# Patient Record
Sex: Male | Born: 1975 | Race: White | Hispanic: No | Marital: Single | Smoking: Never smoker
Health system: Southern US, Community
[De-identification: ages and names within clinical notes are randomized; demographics above are authoritative.]

## PROBLEM LIST (undated history)

## (undated) DIAGNOSIS — E559 Vitamin D deficiency, unspecified: Secondary | ICD-10-CM

## (undated) DIAGNOSIS — R778 Other specified abnormalities of plasma proteins: Secondary | ICD-10-CM

## (undated) DIAGNOSIS — R7301 Impaired fasting glucose: Secondary | ICD-10-CM

## (undated) DIAGNOSIS — E7800 Pure hypercholesterolemia, unspecified: Secondary | ICD-10-CM

## (undated) DIAGNOSIS — L80 Vitiligo: Secondary | ICD-10-CM

## (undated) DIAGNOSIS — Z299 Encounter for prophylactic measures, unspecified: Secondary | ICD-10-CM

## (undated) HISTORY — DX: Encounter for prophylactic measures, unspecified: Z29.9

## (undated) HISTORY — DX: Other specified abnormalities of plasma proteins: R77.8

## (undated) HISTORY — DX: Pure hypercholesterolemia, unspecified: E78.00

## (undated) HISTORY — DX: Vitamin D deficiency, unspecified: E55.9

## (undated) HISTORY — DX: Vitiligo: L80

## (undated) HISTORY — DX: Impaired fasting glucose: R73.01

---

## 2001-10-19 ENCOUNTER — Encounter: Payer: Self-pay | Admitting: Emergency Medicine

## 2001-10-19 ENCOUNTER — Emergency Department (HOSPITAL_COMMUNITY): Admission: EM | Admit: 2001-10-19 | Discharge: 2001-10-19 | Payer: Self-pay | Admitting: Emergency Medicine

## 2016-04-03 ENCOUNTER — Telehealth: Payer: Self-pay

## 2016-04-03 NOTE — Telephone Encounter (Signed)
Records from [facility] placed in your folder for review. Pt has NP appt with you on 04/04/2016 RLB

## 2016-04-04 ENCOUNTER — Encounter: Payer: Self-pay | Admitting: Family Medicine

## 2016-04-04 ENCOUNTER — Ambulatory Visit (INDEPENDENT_AMBULATORY_CARE_PROVIDER_SITE_OTHER): Payer: BC Managed Care – PPO | Admitting: Family Medicine

## 2016-04-04 VITALS — BP 122/82 | HR 87 | Ht 74.5 in | Wt 308.4 lb

## 2016-04-04 DIAGNOSIS — Z833 Family history of diabetes mellitus: Secondary | ICD-10-CM | POA: Diagnosis not present

## 2016-04-04 DIAGNOSIS — E785 Hyperlipidemia, unspecified: Secondary | ICD-10-CM | POA: Diagnosis not present

## 2016-04-04 DIAGNOSIS — E669 Obesity, unspecified: Secondary | ICD-10-CM | POA: Diagnosis not present

## 2016-04-04 DIAGNOSIS — Z23 Encounter for immunization: Secondary | ICD-10-CM

## 2016-04-04 DIAGNOSIS — D229 Melanocytic nevi, unspecified: Secondary | ICD-10-CM

## 2016-04-04 DIAGNOSIS — Z0000 Encounter for general adult medical examination without abnormal findings: Secondary | ICD-10-CM | POA: Diagnosis not present

## 2016-04-04 DIAGNOSIS — Z113 Encounter for screening for infections with a predominantly sexual mode of transmission: Secondary | ICD-10-CM | POA: Diagnosis not present

## 2016-04-04 DIAGNOSIS — Z7251 High risk heterosexual behavior: Secondary | ICD-10-CM

## 2016-04-04 DIAGNOSIS — B351 Tinea unguium: Secondary | ICD-10-CM | POA: Diagnosis not present

## 2016-04-04 HISTORY — DX: Hyperlipidemia, unspecified: E78.5

## 2016-04-04 LAB — POCT URINALYSIS DIPSTICK
Bilirubin, UA: NEGATIVE
Blood, UA: NEGATIVE
Glucose, UA: NEGATIVE
Ketones, UA: NEGATIVE
Leukocytes, UA: NEGATIVE
Nitrite, UA: NEGATIVE
Protein, UA: NEGATIVE
Spec Grav, UA: 1.01
Urobilinogen, UA: NEGATIVE
pH, UA: 6.5

## 2016-04-04 NOTE — Patient Instructions (Addendum)
Your received your Tdap today.  Call and schedule dermatology appointment for a full body screening. [facility] [phone] [facility] will call you for an appointment.   Try using a free app such as my fitness pal to keep an eye on your daily calories  We will call you with lab results.  Preventative Care for Adults, Male       REGULAR HEALTH EXAMS:  A routine yearly physical is a good way to check in with your primary care provider about your health and preventive screening. It is also an opportunity to share updates about your health and any concerns you have, and receive a thorough all-over exam.   Most health insurance companies pay for at least some preventative services.  Check with your health plan for specific coverages.  WHAT PREVENTATIVE SERVICES DO MEN NEED?  Adult men should have their weight and blood pressure checked regularly.   Men age 38 and older should have their cholesterol levels checked regularly.  Beginning at age 83 and continuing to age 53, men should be screened for colorectal cancer.  Certain people should may need continued testing until age 35.  Other cancer screening may include exams for testicular and prostate cancer.  Updating vaccinations is part of preventative care.  Vaccinations help protect against diseases such as the flu.  Lab tests are generally done as part of preventative care to screen for anemia and blood disorders, to screen for problems with the kidneys and liver, to screen for bladder problems, to check blood sugar, and to check your cholesterol level.  Preventative services generally include counseling about diet, exercise, avoiding tobacco, drugs, excessive alcohol consumption, and sexually transmitted infections.    GENERAL RECOMMENDATIONS FOR GOOD HEALTH:  Healthy diet:  Eat a variety of foods, including fruit, vegetables, animal or vegetable protein, such as meat, fish, chicken, and eggs, or beans, lentils, tofu, and  grains, such as rice.  Drink plenty of water daily.  Decrease saturated fat in the diet, avoid lots of red meat, processed foods, sweets, fast foods, and fried foods.  Exercise:  Aerobic exercise helps maintain good heart health. At least 30-40 minutes of moderate-intensity exercise is recommended. For example, a brisk walk that increases your heart rate and breathing. This should be done on most days of the week.   Find a type of exercise or a variety of exercises that you enjoy so that it becomes a part of your daily life.  Examples are running, walking, swimming, water aerobics, and biking.  For motivation and support, explore group exercise such as aerobic class, spin class, Zumba, Yoga,or  martial arts, etc.    Set exercise goals for yourself, such as a certain weight goal, walk or run in a race such as a 5k walk/run.  Speak to your primary care provider about exercise goals.  Disease prevention:  If you smoke or chew tobacco, find out from your caregiver how to quit. It can literally save your life, no matter how long you have been a tobacco user. If you do not use tobacco, never begin.   Maintain a healthy diet and normal weight. Increased weight leads to problems with blood pressure and diabetes.   The Body Mass Index or BMI is a way of measuring how much of your body is fat. Having a BMI above 27 increases the risk of heart disease, diabetes, hypertension, stroke and other problems related to obesity. Your caregiver can help determine your BMI and based on it develop  an exercise and dietary program to help you achieve or maintain this important measurement at a healthful level.  High blood pressure causes heart and blood vessel problems.  Persistent high blood pressure should be treated with medicine if weight loss and exercise do not work.   Fat and cholesterol leaves deposits in your arteries that can block them. This causes heart disease and vessel disease elsewhere in your body.   If your cholesterol is found to be high, or if you have heart disease or certain other medical conditions, then you may need to have your cholesterol monitored frequently and be treated with medication.   Ask if you should have a stress test if your history suggests this. A stress test is a test done on a treadmill that looks for heart disease. This test can find disease prior to there being a problem.  Avoid drinking alcohol in excess (more than two drinks per day).  Avoid use of street drugs. Do not share needles with anyone. Ask for professional help if you need assistance or instructions on stopping the use of alcohol, cigarettes, and/or drugs.  Brush your teeth twice a day with fluoride toothpaste, and floss once a day. Good oral hygiene prevents tooth decay and gum disease. The problems can be painful, unattractive, and can cause other health problems. Visit your dentist for a routine oral and dental check up and preventive care every 6-12 months.   Look at your skin regularly.  Use a mirror to look at your back. Notify your caregivers of changes in moles, especially if there are changes in shapes, colors, a size larger than a pencil eraser, an irregular border, or development of new moles.  Safety:  Use seatbelts 100% of the time, whether driving or as a passenger.  Use safety devices such as hearing protection if you work in environments with loud noise or significant background noise.  Use safety glasses when doing any work that could send debris in to the eyes.  Use a helmet if you ride a bike or motorcycle.  Use appropriate safety gear for contact sports.  Talk to your caregiver about gun safety.  Use sunscreen with a SPF (or skin protection factor) of 15 or greater.  Lighter skinned people are at a greater risk of skin cancer. Don't forget to also wear sunglasses in order to protect your eyes from too much damaging sunlight. Damaging sunlight can accelerate cataract formation.   Practice  safe sex. Use condoms. Condoms are used for birth control and to help reduce the spread of sexually transmitted infections (or STIs).  Some of the STIs are gonorrhea (the clap), chlamydia, syphilis, trichomonas, herpes, HPV (human papilloma virus) and HIV (human immunodeficiency virus) which causes AIDS. The herpes, HIV and HPV are viral illnesses that have no cure. These can result in disability, cancer and death.   Keep carbon monoxide and smoke detectors in your home functioning at all times. Change the batteries every 6 months or use a model that plugs into the wall.   Vaccinations:  Stay up to date with your tetanus shots and other required immunizations. You should have a booster for tetanus every 10 years. Be sure to get your flu shot every year, since 5%-20% of the [place] population comes down with the flu. The flu vaccine changes each year, so being vaccinated once is not enough. Get your shot in the fall, before the flu season peaks.   Other vaccines to consider:  Pneumococcal vaccine to protect against  certain types of pneumonia.  This is normally recommended for adults age 45 or older.  However, adults younger than 38 years old with certain underlying conditions such as diabetes, heart or lung disease should also receive the vaccine.  Shingles vaccine to protect against Varicella Zoster if you are older than age 24, or younger than 38 years old with certain underlying illness.  Hepatitis A vaccine to protect against a form of infection of the liver by a virus acquired from food.  Hepatitis B vaccine to protect against a form of infection of the liver by a virus acquired from blood or body fluids, particularly if you work in health care.  If you plan to travel internationally, check with your local health department for specific vaccination recommendations.  Cancer Screening:  Most routine colon cancer screening begins at the age of 42. On a yearly basis, doctors may provide special  easy to use take-home tests to check for hidden blood in the stool. Sigmoidoscopy or colonoscopy can detect the earliest forms of colon cancer and is life saving. These tests use a small camera at the end of a tube to directly examine the colon. Speak to your caregiver about this at age 94, when routine screening begins (and is repeated every 5 years unless early forms of pre-cancerous polyps or small growths are found).   At the age of 55 men usually start screening for prostate cancer every year. Screening may begin at a younger age for those with higher risk. Those at higher risk include African-Americans or having a family history of prostate cancer. There are two types of tests for prostate cancer:   Prostate-specific antigen (PSA) testing. Recent studies raise questions about prostate cancer using PSA and you should discuss this with your caregiver.   Digital rectal exam (in which your doctor's lubricated and gloved finger feels for enlargement of the prostate through the anus).   Screening for testicular cancer.  Do a monthly exam of your testicles. Gently roll each testicle between your thumb and fingers, feeling for any abnormal lumps. The best time to do this is after a hot shower or bath when the tissues are looser. Notify your caregivers of any lumps, tenderness or changes in size or shape immediately.

## 2016-04-04 NOTE — Progress Notes (Signed)
Subjective:    Patient ID: Andrew Harrington, male    DOB: 23-Feb-1976, 40 y.o.   MRN: [id number]  HPI Chief Complaint  Patient presents with  . Other    fasting cpe, eye exam done within a year. will get flu shot at work in november   He is new to the practice and here for a complete physical exam. Previous medical care: nothing regular since 2009 Last CPE: 2009 Complains of fungus to toenails, ongoing for years. Denies any pain or tenderness. Would like to discuss possibility of starting PrEP due to having sex with men and women. Denies history of HIV.   Only takes a MVI.   Other providers: Eyes- [facility]. Dentist- Dr. Mariel Sleet  Past medical history: sore throats occasionally Surgeries: none  Family history: Diabetes in Mother.   Social history: [place], works as a Actor,  Denies smoking and drug use, drinks alcohol occasionally  Diet: Non-discretionary. Exercise: walks the parking lot at school during the lunch hour.   Immunizations: needs Tdap  Health maintenance:  Colonoscopy: never Last PSA: never Last Dental Exam: twice a year Last Eye Exam: 04/2015 wears contact lenses.  Sexual activity: yes, new sexual partners including with men and women.  STD- denies history of  Wears seatbelt always, uses sunscreen, smoke detectors in home and functioning, does not text while driving, feels safe in home environment.  Reviewed allergies, medications, past medical, surgical, family, and social history.   Review of Systems Review of Systems Constitutional: -fever, -chills, -sweats, -unexpected weight change,-fatigue ENT: -runny nose, -ear pain, -sore throat Cardiology:  -chest pain, -palpitations, -edema Respiratory: -cough, -shortness of breath, -wheezing Gastroenterology: -abdominal pain, -nausea, -vomiting, -diarrhea, -constipation  Hematology: -bleeding or bruising problems Musculoskeletal: -arthralgias, -myalgias, -joint  swelling, -back pain Ophthalmology: -vision changes Urology: -dysuria, -difficulty urinating, -hematuria, -urinary frequency, -urgency Neurology: -headache, -weakness, -tingling, -numbness       Objective:   Physical Exam BP 122/82   Pulse 87   Ht 6' 2.5" (1.892 m)   Wt (!) 308 lb 6.4 oz (139.9 kg)   BMI 39.07 kg/m   General Appearance:    Alert, cooperative, no distress, appears stated age  Head:    Normocephalic, without obvious abnormality, atraumatic  Eyes:    PERRL, conjunctiva/corneas clear, EOM's intact, fundi    benign  Ears:    Normal TM's and external ear canals  Nose:   Nares normal, mucosa normal, no drainage or sinus   tenderness  Throat:   Lips, mucosa, and tongue normal; teeth and gums normal  Neck:   Supple, no lymphadenopathy;  thyroid:  no   enlargement/tenderness/nodules; no carotid   bruit or JVD  Back:    Spine nontender, no curvature, ROM normal, no CVA     tenderness  Lungs:     Clear to auscultation bilaterally without wheezes, rales or     ronchi; respirations unlabored  Chest Wall:    No tenderness or deformity   Heart:    Regular rate and rhythm, S1 and S2 normal, no murmur, rub   or gallop  Breast Exam:    No chest wall tenderness, masses or gynecomastia  Abdomen:     Soft, non-tender, nondistended, normoactive bowel sounds,    no masses, no hepatosplenomegaly  Genitalia:    Normal male external genitalia without lesions.  Testicles without masses.  No inguinal hernias.  Rectal:   Deferred due to age <40 and lack of symptoms  Extremities:   No clubbing, cyanosis or edema  Pulses:   2+ and symmetric all extremities  Skin:   Skin color, texture, turgor normal, no rashes or lesions, numerous nevi throughout. Plantar surfaces of bilateral feet with dry skin and peeling. Discolored and thickened toenails throughout   Lymph nodes:   Cervical, supraclavicular, and axillary nodes normal  Neurologic:   CNII-XII intact, normal strength, sensation and gait;  reflexes 2+ and symmetric throughout          Psych:   Normal mood, affect, hygiene and grooming.     Urinalysis dipstick:  negative      Assessment & Plan:  Routine general medical examination at a health care facility - Plan: CBC with Differential/Platelet, Comprehensive metabolic panel, POCT urinalysis dipstick, TSH  Family history of diabetes mellitus (DM)  Obesity - Plan: TSH  High risk sexual behavior - Plan: HIV antibody, RPR, GC/Chlamydia Probe Amp, Hepatitis B surface antibody, Hepatitis B core antibody, IgM, Hepatitis B surface antigen, CANCELED: Hepatitis B surface antigen, CANCELED: Hepatitis B core antibody, IgM  Screening for STD (sexually transmitted disease) - Plan: HIV antibody, RPR  Hyperlipidemia - Plan: Lipid panel  Need for Tdap vaccination - Plan: Tdap vaccine greater than or equal to 7yo IM  Onychomycosis - Plan: Ambulatory referral to Podiatry  Multiple nevi - Plan: Ambulatory referral to Dermatology  Advised that he is obese her BMI. Recommend getting at least 150 minutes of physical activity per week and eating a healthy well balanced diet. Advised to start using a free app called my fitness pal start tracking daily caloric intake. Discussed positive lifestyle modifications to avoid or postpone the development of chronic illnesses. He does report a history of hyperlipidemia and was on a medication at one point. Discussed safety and health promotion. He is considering starting PrEP due to having SWM and will order appropriate testing in case he decides to go forward with this. Discussed that prep is in addition to using condoms and safe sex practices and night in lieu of. Information given to patient in writing on [employer].  Referral made to podiatry for onychomycosis. Referral made to dermatology for a full body screening due to numerous nevi. Recommend continued use of sunscreen.  Follow up pending labs or in 1 year.

## 2016-04-05 LAB — COMPREHENSIVE METABOLIC PANEL
ALT: 14 U/L (ref 9–46)
AST: 14 U/L (ref 10–40)
Albumin: 4.5 g/dL (ref 3.6–5.1)
Alkaline Phosphatase: 69 U/L (ref 40–115)
BUN: 7 mg/dL (ref 7–25)
CO2: 23 mmol/L (ref 20–31)
Calcium: 9.5 mg/dL (ref 8.6–10.3)
Chloride: 100 mmol/L (ref 98–110)
Creat: 1.13 mg/dL (ref 0.60–1.35)
Glucose, Bld: 97 mg/dL (ref 65–99)
Potassium: 4.4 mmol/L (ref 3.5–5.3)
Sodium: 135 mmol/L (ref 135–146)
Total Bilirubin: 0.4 mg/dL (ref 0.2–1.2)
Total Protein: 7.2 g/dL (ref 6.1–8.1)

## 2016-04-05 LAB — TSH: TSH: 0.86 mIU/L (ref 0.40–4.50)

## 2016-04-05 LAB — CBC WITH DIFFERENTIAL/PLATELET
Basophils Absolute: 0 cells/uL (ref 0–200)
Basophils Relative: 0 %
Eosinophils Absolute: 72 cells/uL (ref 15–500)
Eosinophils Relative: 1 %
HCT: 42.8 % (ref 38.5–50.0)
Hemoglobin: 14.7 g/dL (ref 13.2–17.1)
Lymphocytes Relative: 29 %
Lymphs Abs: 2088 cells/uL (ref 850–3900)
MCH: 29.9 pg (ref 27.0–33.0)
MCHC: 34.3 g/dL (ref 32.0–36.0)
MCV: 87 fL (ref 80.0–100.0)
MPV: 9.2 fL (ref 7.5–12.5)
Monocytes Absolute: 360 cells/uL (ref 200–950)
Monocytes Relative: 5 %
Neutro Abs: 4680 cells/uL (ref 1500–7800)
Neutrophils Relative %: 65 %
Platelets: 309 10*3/uL (ref 140–400)
RBC: 4.92 MIL/uL (ref 4.20–5.80)
RDW: 13.6 % (ref 11.0–15.0)
WBC: 7.2 10*3/uL (ref 4.0–10.5)

## 2016-04-05 LAB — GC/CHLAMYDIA PROBE AMP
CT Probe RNA: NOT DETECTED
GC Probe RNA: NOT DETECTED

## 2016-04-05 LAB — LIPID PANEL
Cholesterol: 191 mg/dL (ref 125–200)
HDL: 37 mg/dL — ABNORMAL LOW (ref >=40)
LDL Cholesterol: 101 mg/dL (ref <130)
Total CHOL/HDL Ratio: 5.2 Ratio — ABNORMAL HIGH (ref <=5.0)
Triglycerides: 265 mg/dL — ABNORMAL HIGH (ref <150)
VLDL: 53 mg/dL — ABNORMAL HIGH (ref <30)

## 2016-04-05 LAB — HEPATITIS B SURFACE ANTIBODY,QUALITATIVE: Hep B S Ab: NEGATIVE

## 2016-04-05 LAB — RPR

## 2016-04-05 LAB — HEPATITIS B SURFACE ANTIGEN: HEP B S AG: NEGATIVE

## 2016-04-05 LAB — HEPATITIS B CORE ANTIBODY, IGM: Hep B C IgM: NONREACTIVE

## 2016-04-05 LAB — HIV ANTIBODY (ROUTINE TESTING W REFLEX): HIV 1&2 Ab, 4th Generation: NONREACTIVE

## 2016-04-06 ENCOUNTER — Encounter: Payer: Self-pay | Admitting: Internal Medicine

## 2016-04-06 ENCOUNTER — Other Ambulatory Visit: Payer: Self-pay | Admitting: Family Medicine

## 2016-04-06 DIAGNOSIS — Z609 Problem related to social environment, unspecified: Secondary | ICD-10-CM

## 2016-04-06 DIAGNOSIS — Z79899 Other long term (current) drug therapy: Secondary | ICD-10-CM

## 2016-04-06 MED ORDER — EMTRICITABINE-TENOFOVIR DF 200-300 MG PO TABS: 1.0000 | ORAL_TABLET | Freq: Every day | ORAL | 2 refills | Status: DC

## 2016-04-13 ENCOUNTER — Ambulatory Visit (INDEPENDENT_AMBULATORY_CARE_PROVIDER_SITE_OTHER): Payer: BC Managed Care – PPO | Admitting: Family Medicine

## 2016-04-13 ENCOUNTER — Encounter: Payer: Self-pay | Admitting: Family Medicine

## 2016-04-13 VITALS — BP 120/82 | HR 62 | Temp 98.4°F | Wt 304.4 lb

## 2016-04-13 DIAGNOSIS — H109 Unspecified conjunctivitis: Secondary | ICD-10-CM | POA: Diagnosis not present

## 2016-04-13 MED ORDER — POLYMYXIN B-TRIMETHOPRIM 10000-0.1 UNIT/ML-% OP SOLN: 2.0000 [drp] | Freq: Four times a day (QID) | OPHTHALMIC | 0 refills | Status: DC

## 2016-04-13 MED ORDER — POLYMYXIN B-TRIMETHOPRIM 10000-0.1 UNIT/ML-% OP SOLN
1.0000 [drp] | Freq: Four times a day (QID) | OPHTHALMIC | 0 refills | Status: DC
Start: 2016-04-13 — End: 2016-04-25

## 2016-04-13 NOTE — Patient Instructions (Addendum)
Use 1 drop to right eye four times daily for the next 5 days. Use good hand hygiene and change out your contact lenses.    Bacterial Conjunctivitis Bacterial conjunctivitis, commonly called pink eye, is an inflammation of the clear membrane that covers the white part of the eye (conjunctiva). The inflammation can also happen on the underside of the eyelids. The blood vessels in the conjunctiva become inflamed, causing the eye to become red or pink. Bacterial conjunctivitis may spread easily from one eye to another and from person to person (contagious).  CAUSES  Bacterial conjunctivitis is caused by bacteria. The bacteria may come from your own skin, your upper respiratory tract, or from someone else with bacterial conjunctivitis. SYMPTOMS  The normally white color of the eye or the underside of the eyelid is usually pink or red. The pink eye is usually associated with irritation, tearing, and some sensitivity to light. Bacterial conjunctivitis is often associated with a thick, yellowish discharge from the eye. The discharge may turn into a crust on the eyelids overnight, which causes your eyelids to stick together. If a discharge is present, there may also be some blurred vision in the affected eye. DIAGNOSIS  Bacterial conjunctivitis is diagnosed by your caregiver through an eye exam and the symptoms that you report. Your caregiver looks for changes in the surface tissues of your eyes, which may point to the specific type of conjunctivitis. A sample of any discharge may be collected on a cotton-tip swab if you have a severe case of conjunctivitis, if your cornea is affected, or if you keep getting repeat infections that do not respond to treatment. The sample will be sent to a lab to see if the inflammation is caused by a bacterial infection and to see if the infection will respond to antibiotic medicines. TREATMENT   Bacterial conjunctivitis is treated with antibiotics. Antibiotic eyedrops are most  often used. However, antibiotic ointments are also available. Antibiotics pills are sometimes used. Artificial tears or eye washes may ease discomfort. HOME CARE INSTRUCTIONS   To ease discomfort, apply a cool, clean washcloth to your eye for 10-20 minutes, 3-4 times a day.  Gently wipe away any drainage from your eye with a warm, wet washcloth or a cotton ball.  Wash your hands often with soap and water. Use paper towels to dry your hands.  Do not share towels or washcloths. This may spread the infection.  Change or wash your pillowcase every day.  You should not use eye makeup until the infection is gone.  Do not operate machinery or drive if your vision is blurred.  Stop using contact lenses. Ask your caregiver how to sterilize or replace your contacts before using them again. This depends on the type of contact lenses that you use.  When applying medicine to the infected eye, do not touch the edge of your eyelid with the eyedrop bottle or ointment tube. SEEK IMMEDIATE MEDICAL CARE IF:   Your infection has not improved within 3 days after beginning treatment.  You had yellow discharge from your eye and it returns.  You have increased eye pain.  Your eye redness is spreading.  Your vision becomes blurred.  You have a fever or persistent symptoms for more than 2-3 days.  You have a fever and your symptoms suddenly get worse.  You have facial pain, redness, or swelling. MAKE SURE YOU:   Understand these instructions.  Will watch your condition.  Will get help right away if you are  not doing well or get worse.   This information is not intended to replace advice given to you by your health care provider. Make sure you discuss any questions you have with your health care provider.   Document Released: 08/06/2005 Document Revised: 08/27/2014 Document Reviewed: 01/07/2012 Elsevier Interactive Patient Education [employer].

## 2016-04-13 NOTE — Progress Notes (Signed)
   Subjective:    Patient ID: Andrew Harrington, male    DOB: [birth year], 40 y.o.   MRN: [id number]  HPI Chief Complaint  Patient presents with  . Conjunctivitis    pink eye- crusty stuff- puffy    He is here with complaints of a day history of a 2 day history of right eye being red and then last night he noticed puffiness to his upper eyelid. He states this morning he woke up with purulent drainage to the right eye. No issues with vision or foreign body sensation. No eye pain. Denies itching Denies fever, chills, headache, nasal congestion, rhinorrhea, ear pain, sore throat, nausea, vomiting. He does wear contact lenses and has not worn them in the past 24 hours.   No past medical history on file. Reviewed allergies, medications, past medical,and social history.   Review of Systems Pertinent positives and negatives in the history of present illness.     Objective:   Physical Exam  Constitutional: He appears well-developed and well-nourished. No distress.  HENT:  Mouth/Throat: Uvula is midline, oropharynx is clear and moist and mucous membranes are normal.  Eyes: EOM are normal. Pupils are equal, round, and reactive to light. Right eye exhibits discharge. Right conjunctiva is injected.  Neck: Normal range of motion. Neck supple.  Skin: Skin is warm and dry. No rash noted. No pallor.   BP 120/82   Pulse 62   Temp 98.4 F (36.9 C) (Oral)   Wt (!) 304 lb 6.4 oz (138.1 kg)   BMI 38.56 kg/m       Assessment & Plan:  Conjunctivitis of right eye  Polytrim prescribed. Discussed management of bacterial conjunctivitis including using good hand hygiene. Recommend that he change contact lenses. He will follow up as needed.

## 2016-04-25 ENCOUNTER — Ambulatory Visit (INDEPENDENT_AMBULATORY_CARE_PROVIDER_SITE_OTHER): Payer: BC Managed Care – PPO | Admitting: Podiatry

## 2016-04-25 ENCOUNTER — Encounter: Payer: Self-pay | Admitting: Podiatry

## 2016-04-25 VITALS — Resp 16 | Ht 74.0 in | Wt 300.0 lb

## 2016-04-25 DIAGNOSIS — B351 Tinea unguium: Secondary | ICD-10-CM | POA: Diagnosis not present

## 2016-04-25 MED ORDER — TERBINAFINE HCL 250 MG PO TABS: 250.0000 mg | ORAL_TABLET | Freq: Every day | ORAL | 0 refills | Status: DC

## 2016-04-25 NOTE — Progress Notes (Signed)
   Subjective:    Patient ID: Andrew Harrington, male    DOB: 02-Oct-1975, 40 y.o.   MRN: [id number]  HPI Chief Complaint  Patient presents with  . Nail Problem    Bilateral; discoloration & thickened nails; pt stated, "wants nails checked for nail fungus"      Review of Systems  All other systems reviewed and are negative.      Objective:   Physical Exam        Assessment & Plan:

## 2016-04-26 NOTE — Progress Notes (Signed)
Subjective:     Patient ID: Andrew Harrington, male   DOB: 07-11-1976, 40 y.o.   MRN: [id number]  HPI patient states she's had problems with his nails and skin for a number of years and they continue to discolor and get thick and he's tried topical without relief   Review of Systems  All other systems reviewed and are negative.      Objective:   Physical Exam  Constitutional: He is oriented to person, place, and time.  Cardiovascular: Intact distal pulses.   Musculoskeletal: Normal range of motion.  Neurological: He is oriented to person, place, and time.  Skin: Skin is warm.  Nursing note and vitals reviewed.  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 Also we did order blood work to check liver function at this time

## 2016-05-01 LAB — HEPATIC FUNCTION PANEL
ALBUMIN: 4.3 g/dL (ref 3.6–5.1)
ALK PHOS: 67 U/L (ref 40–115)
ALT: 12 U/L (ref 9–46)
AST: 14 U/L (ref 10–40)
BILIRUBIN DIRECT: 0.1 mg/dL (ref <=0.2)
Indirect Bilirubin: 0.3 mg/dL (ref 0.2–1.2)
Total Bilirubin: 0.4 mg/dL (ref 0.2–1.2)
Total Protein: 7 g/dL (ref 6.1–8.1)

## 2016-05-03 ENCOUNTER — Encounter: Payer: Self-pay | Admitting: Family Medicine

## 2016-05-09 ENCOUNTER — Telehealth: Payer: Self-pay | Admitting: Family Medicine

## 2016-05-09 MED ORDER — TERBINAFINE HCL 250 MG PO TABS: 250.0000 mg | ORAL_TABLET | Freq: Every day | ORAL | 0 refills | Status: DC

## 2016-05-09 MED ORDER — EMTRICITABINE-TENOFOVIR DF 200-300 MG PO TABS: 1.0000 | ORAL_TABLET | Freq: Every day | ORAL | 2 refills | Status: DC

## 2016-05-09 NOTE — Telephone Encounter (Signed)
Pt called stating that he need to pick up a refill on [address] however his insurance requires that he get all of [facility] meds from [facility] so pt wants meds transferred to [place] [facility] [address]

## 2016-05-09 NOTE — Telephone Encounter (Signed)
Sent meds to new pharmacy.  

## 2016-05-29 ENCOUNTER — Ambulatory Visit (INDEPENDENT_AMBULATORY_CARE_PROVIDER_SITE_OTHER): Payer: BC Managed Care – PPO

## 2016-05-29 DIAGNOSIS — B351 Tinea unguium: Secondary | ICD-10-CM

## 2016-05-29 NOTE — Progress Notes (Signed)
Pt presents with mycotic infection of nails Rt 1,2,3,4,5 Lt 1,3,5  All other systems are negative  Laser therapy administered to affected nails and tolerated well. All safety precautions were in place, re-appointed in 1 month for 2nd therapy. Progress pictures were obtained at this visit

## 2016-06-27 ENCOUNTER — Ambulatory Visit (INDEPENDENT_AMBULATORY_CARE_PROVIDER_SITE_OTHER): Payer: Self-pay

## 2016-06-27 DIAGNOSIS — B351 Tinea unguium: Secondary | ICD-10-CM

## 2016-06-27 NOTE — Progress Notes (Signed)
Pt presents with mycotic infection of nails Rt 1,2,3,4,5 Lt 1,3,5  All other systems are negative  Laser therapy administered to affected nails and tolerated well. All safety precautions were in place, re-appointed in 1 month for 3rd therapy. Progress pictures were obtained at this visit

## 2016-07-05 ENCOUNTER — Ambulatory Visit: Payer: BC Managed Care – PPO | Admitting: Family Medicine

## 2016-07-05 ENCOUNTER — Other Ambulatory Visit: Payer: BC Managed Care – PPO

## 2016-07-05 DIAGNOSIS — Z79899 Other long term (current) drug therapy: Secondary | ICD-10-CM

## 2016-07-05 DIAGNOSIS — Z609 Problem related to social environment, unspecified: Secondary | ICD-10-CM

## 2016-07-06 ENCOUNTER — Other Ambulatory Visit: Payer: Self-pay | Admitting: Family Medicine

## 2016-07-06 LAB — HIV ANTIBODY (ROUTINE TESTING W REFLEX): HIV: NONREACTIVE

## 2016-07-06 MED ORDER — EMTRICITABINE-TENOFOVIR DF 200-300 MG PO TABS: 1.0000 | ORAL_TABLET | Freq: Every day | ORAL | 2 refills | Status: DC

## 2016-07-20 ENCOUNTER — Other Ambulatory Visit: Payer: Self-pay | Admitting: Family Medicine

## 2016-07-20 NOTE — Telephone Encounter (Signed)
Ok to refill for 3 months. He had a negative HIV test 2 weeks ago.

## 2016-07-20 NOTE — Telephone Encounter (Signed)
See request below

## 2016-07-30 ENCOUNTER — Ambulatory Visit: Payer: Self-pay | Admitting: Podiatry

## 2016-07-30 DIAGNOSIS — B351 Tinea unguium: Secondary | ICD-10-CM

## 2016-07-30 NOTE — Progress Notes (Signed)
Pt presents with mycotic infection of nails Rt 1,2,3,4,5 Lt 1,3,5  All other systems are negative  Laser therapy administered to affected nails and tolerated well. All safety precautions were in place, re-appointed in 1 month for 5th and possibly final therapy

## 2016-08-06 ENCOUNTER — Encounter: Payer: Self-pay | Admitting: Medical

## 2016-08-06 ENCOUNTER — Ambulatory Visit (INDEPENDENT_AMBULATORY_CARE_PROVIDER_SITE_OTHER): Payer: BC Managed Care – PPO | Admitting: Medical

## 2016-08-06 VITALS — BP 130/80 | HR 66 | Temp 98.3°F | Resp 18 | Wt 303.6 lb

## 2016-08-06 DIAGNOSIS — J0110 Acute frontal sinusitis, unspecified: Secondary | ICD-10-CM

## 2016-08-06 MED ORDER — AMOXICILLIN 500 MG PO TABS: ORAL_TABLET | ORAL | 0 refills | Status: DC

## 2016-08-06 NOTE — Progress Notes (Signed)
Subjective:  Andrew Harrington is a 40 y.o. male who presents for possible sinus infection.  Started almost a week ago with head and sinus pressure, nasal congestion, headache, and was worse [place]. Denies fever, NVD, no SOB, no teeth pain or sore throat.   Using afrin, sudafed and this helps a little but symptoms are progressively getting worse.  denies sick contacts.  Patient is not a smoker. No other aggravating or relieving factors.  No other c/o.  No significant past medical history  ROS as in subjective   Objective: BP 130/80   Pulse 66   Temp 98.3 F (36.8 C) (Oral)   Resp 18   Wt (!) 303 lb 9.6 oz (137.7 kg)   SpO2 98%   BMI 38.98 kg/m   General appearance: Alert, WD/WN, no distress                             Skin: warm, no rash                           Head: +frontal sinus tenderness,                            Eyes: conjunctiva normal, corneas clear, PERRLA                            Ears: pearly TMs, external ear canals normal                          Nose: septum midline, turbinates swollen, with erythema and clear discharge             Mouth/throat: MMM, tongue normal, mild pharyngeal erythema                           Neck: supple, no adenopathy, no thyromegaly, non tender                         Lungs: CTA bilaterally, no wheezes, rales, or rhonchi       Assessment  Encounter Diagnosis  Name Primary?  . Acute frontal sinusitis, recurrence not specified Yes      Plan: Discuss exam findings.  Begin amoxicillin, need to limit afrin to QHS 1-2 more nights only.     Specific home care recommendations today include:  Only take over-the-counter (OTC) or prescription medicines for pain, discomfort, or fever as directed by your caregiver.    Decongestant: You may use OTC Guaifenesin (Mucinex plain) for congestion.  You may use Pseudoephedrine (Sudafed) only if you don't have blood pressure problems or a  diagnosis of hypertension.  Cough suppression: If you have cough from drainage, you may use over-the-counter Dextromethorphan (Delsym) as directed on the label  Pain/fever relief: You may use over-the-counter Tylenol for pain or fever  Drink extra fluids. Fluids help thin the mucus so your sinuses can drain more easily.   Applying either moist heat or ice packs to the sinus areas may help relieve discomfort.  Use saline nasal sprays to help moisten your sinuses. The sprays can be found at your local drugstore.   Andrew Harrington was seen today for nasal congestion.  Diagnoses and all  orders for this visit:  Acute frontal sinusitis, recurrence not specified  Other orders -     amoxicillin (AMOXIL) 500 MG tablet; 2 tablets po BID x 10 days  Patient was advised to call or return if worse or not improving in the next few days.    Patient voiced understanding of diagnosis, recommendations, and treatment plan.

## 2016-09-03 ENCOUNTER — Ambulatory Visit: Payer: Self-pay | Admitting: Podiatry

## 2016-09-03 DIAGNOSIS — B351 Tinea unguium: Secondary | ICD-10-CM

## 2016-09-03 NOTE — Progress Notes (Signed)
Pt presents with mycotic infection of nails Rt 1,2,3,4,5 Lt 1,3,5  All other systems are negative  Laser therapy administered to affected nails and tolerated well. All safety precautions were in place, re-appointed as needed

## 2016-09-19 ENCOUNTER — Encounter: Payer: Self-pay | Admitting: Family Medicine

## 2016-09-19 ENCOUNTER — Ambulatory Visit (INDEPENDENT_AMBULATORY_CARE_PROVIDER_SITE_OTHER): Payer: BC Managed Care – PPO | Admitting: Family Medicine

## 2016-09-19 VITALS — BP 124/80 | HR 77 | Wt 303.2 lb

## 2016-09-19 DIAGNOSIS — Z23 Encounter for immunization: Secondary | ICD-10-CM

## 2016-09-19 DIAGNOSIS — N529 Male erectile dysfunction, unspecified: Secondary | ICD-10-CM | POA: Diagnosis not present

## 2016-09-19 MED ORDER — SILDENAFIL CITRATE 100 MG PO TABS: 50.0000 mg | ORAL_TABLET | Freq: Every day | ORAL | 3 refills | Status: DC | PRN

## 2016-09-19 NOTE — Progress Notes (Signed)
   Subjective:    Patient ID: Andrew Harrington, male    DOB: Mar 09, 1976, 41 y.o.   MRN: [id number]  HPI Chief Complaint  Patient presents with  . discuss ED    discuss ED- happen the last 3 times out of 4 times.   He is here with complaints of difficulty keeping an erection 2 out of 3 times over the past 6 weeks. He is able to get an erection without difficulty. No other concerns or complaints.   No new medications. Denies history of diabetes, HTN or PVD.   Denies fever, chills, night sweats, fatigue, chest pain, palpitations, shortness of breath, abdominal pain, back pain, GI or GU symptoms.   Is not a smoker. Alcohol- 1 beer typically. Denies drug use.   Has been exercising recently and is trying to lose weight.   Recent labs show that he is Hep B antibody negative. He has high risk sexual behavior, sex with men and needs Hep B series.   Reviewed allergies, medications, past medical, surgical, and social history.   Review of Systems Pertinent positives and negatives in the history of present illness.     Objective:   Physical Exam  Constitutional: He appears well-developed and well-nourished. No distress.  Cardiovascular: Normal rate, regular rhythm, normal heart sounds and intact distal pulses.   Pulmonary/Chest: Effort normal and breath sounds normal.  Genitourinary: Testes normal and penis normal. Cremasteric reflex is present. Circumcised.  Skin: Skin is warm and dry. No rash noted. No pallor.  Psychiatric: He has a normal mood and affect. His speech is normal and behavior is normal. Thought content normal.   BP 124/80   Pulse 77   Wt (!) 303 lb 3.2 oz (137.5 kg)   BMI 38.93 kg/m      Assessment & Plan:  Unable to sustain erection - Plan: sildenafil (VIAGRA) 100 MG tablet  Need for hepatitis B vaccination - Plan: Hepatitis B vaccine adult IM  Discussed use and potential side effects of viagra. Prescription sent to pharmacy.  He is hep B antibody negative and high  risk sexual behavior. Plan to start the series today.  Hep B shot given.

## 2016-10-07 ENCOUNTER — Telehealth: Payer: Self-pay | Admitting: Family Medicine

## 2016-10-07 NOTE — Telephone Encounter (Signed)
Left vm for him to come in fasting for his visit tomorrow. Need to repeat fasting lipids.

## 2016-10-08 ENCOUNTER — Ambulatory Visit (INDEPENDENT_AMBULATORY_CARE_PROVIDER_SITE_OTHER): Payer: BC Managed Care – PPO | Admitting: Family Medicine

## 2016-10-08 ENCOUNTER — Encounter: Payer: Self-pay | Admitting: Family Medicine

## 2016-10-08 VITALS — BP 110/70 | HR 70 | Wt 299.4 lb

## 2016-10-08 DIAGNOSIS — Z79899 Other long term (current) drug therapy: Secondary | ICD-10-CM

## 2016-10-08 DIAGNOSIS — E782 Mixed hyperlipidemia: Secondary | ICD-10-CM | POA: Diagnosis not present

## 2016-10-08 LAB — LIPID PANEL
Cholesterol: 205 mg/dL — ABNORMAL HIGH (ref <200)
HDL: 38 mg/dL — AB (ref >40)
LDL Cholesterol: 120 mg/dL — ABNORMAL HIGH (ref <100)
TRIGLYCERIDES: 235 mg/dL — AB (ref <150)
Total CHOL/HDL Ratio: 5.4 Ratio — ABNORMAL HIGH (ref <5.0)
VLDL: 47 mg/dL — ABNORMAL HIGH (ref <30)

## 2016-10-08 NOTE — Progress Notes (Signed)
   Subjective:    Patient ID: Andrew Harrington, male    DOB: [birth year], 41 y.o.   MRN: [id number]  HPI Chief Complaint  Patient presents with  . fasting follow-up    fasting follow-up   He is here for a fasting lab visit due to abnormal lipid panel in August 2017. He has lost 4 lbs. States he has been exercising and eating healthier. Less fried food. No concerns or complaints today.   He is taking Truvada and doing well on medication. No side effects. Has been on medication for PrEP for 6 months due to same sex relationships.   Denies fever, chills, body aches, chest pain, cough, abdominal pain, N/V/D, urinary frequency, dysuria or penile discharge.    Reviewed allergies, medications, past medical, and social history.   Review of Systems Pertinent positives and negatives in the history of present illness.     Objective:   Physical Exam BP 110/70   Pulse 70   Wt 299 lb 6.4 oz (135.8 kg)   SpO2 98%   BMI 38.44 kg/m   Alert and oriented and in no acute distress. Not otherwise examined.       Assessment & Plan:  Encounter for medication management - Plan: GC/Chlamydia Probe Amp, RPR, HIV antibody  Mixed hyperlipidemia - Plan: Lipid panel  Discussed that he appears to be doing well and making healthy lifestyle changes. Congratulated him on 4 lb weight loss. Advised on low fat and low cholesterol diet. Continue exercising.  Will repeat fasting lipids today.  Doing fine on Truvada for PrEP. Will check HIV, RPR and GC/CT.  Follow up and refill Truvada once labs are back.  He will follow up for lab visit for HIV in 3 months per protocol.

## 2016-10-08 NOTE — Addendum Note (Signed)
Addended by: Carolee Rota F on: 10/08/2016 01:02 PM   Modules accepted: Orders

## 2016-10-08 NOTE — Patient Instructions (Signed)

## 2016-10-09 ENCOUNTER — Other Ambulatory Visit: Payer: Self-pay | Admitting: Family Medicine

## 2016-10-09 DIAGNOSIS — Z79899 Other long term (current) drug therapy: Secondary | ICD-10-CM

## 2016-10-09 LAB — GC/CHLAMYDIA PROBE AMP
CT Probe RNA: NOT DETECTED
GC Probe RNA: NOT DETECTED

## 2016-10-09 LAB — RPR

## 2016-10-09 LAB — HIV ANTIBODY (ROUTINE TESTING W REFLEX): HIV 1&2 Ab, 4th Generation: NONREACTIVE

## 2016-10-09 MED ORDER — EMTRICITABINE-TENOFOVIR DF 200-300 MG PO TABS: ORAL_TABLET | ORAL | 2 refills | Status: DC

## 2016-10-12 ENCOUNTER — Encounter: Payer: Self-pay | Admitting: Family Medicine

## 2016-10-15 ENCOUNTER — Other Ambulatory Visit: Payer: Self-pay | Admitting: Family Medicine

## 2016-10-15 MED ORDER — EMTRICITABINE-TENOFOVIR DF 200-300 MG PO TABS: ORAL_TABLET | ORAL | 0 refills | Status: DC

## 2016-10-17 ENCOUNTER — Ambulatory Visit (INDEPENDENT_AMBULATORY_CARE_PROVIDER_SITE_OTHER): Payer: BC Managed Care – PPO | Admitting: Medical

## 2016-10-17 ENCOUNTER — Encounter: Payer: Self-pay | Admitting: Medical

## 2016-10-17 VITALS — BP 140/98 | HR 92 | Temp 98.5°F | Wt 299.4 lb

## 2016-10-17 DIAGNOSIS — J029 Acute pharyngitis, unspecified: Secondary | ICD-10-CM | POA: Diagnosis not present

## 2016-10-17 DIAGNOSIS — J020 Streptococcal pharyngitis: Secondary | ICD-10-CM | POA: Diagnosis not present

## 2016-10-17 DIAGNOSIS — R6889 Other general symptoms and signs: Secondary | ICD-10-CM | POA: Diagnosis not present

## 2016-10-17 LAB — POC INFLUENZA A&B (BINAX/QUICKVUE)
INFLUENZA A, POC: NEGATIVE
INFLUENZA B, POC: NEGATIVE

## 2016-10-17 MED ORDER — AMOXICILLIN 500 MG PO CAPS: 500.0000 mg | ORAL_CAPSULE | Freq: Three times a day (TID) | ORAL | 0 refills | Status: DC

## 2016-10-17 NOTE — Progress Notes (Signed)
  Subjective:  Andrew Harrington is a 41 y.o. male who presents for illness.  Started 2 nights ago with not feeling well, not sleeping well.   Worse by end of the day the first day.   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   He reports chills, body aches, not sure about fever.   Has sore throat, no cough.   No runny nose, no sneezing.   No ear pain, no NVD.   Hasn't eaten much in the past day or 2.   Drinking water though.   Nonsmoker.  Using some ibuprofen last night for headache.  No other aggravating or relieving factors.  No other c/o.  The following portions of the patient's history were reviewed and updated as appropriate: allergies, current medications, past medical history, past social history and problem list.  ROS as in subjective   No past medical history on file.   Objective: BP (!) 140/98   Pulse 92   Temp 98.5 F (36.9 C)   Wt 299 lb 6.4 oz (135.8 kg)   SpO2 99%   BMI 38.44 kg/m   General: somewhat Ill-appearing, well-developed, well-nourished Skin: warm, dry HEENT: Nose inflamed and congested, clear conjunctiva, TMs pearly, no sinus tenderness, pharynx with moderate erythema, no exudates Neck: Supple, non tender, shotty cervical adenopathy Heart: Regular rate and rhythm, normal S1, S2, no murmurs Lungs: Clear to auscultation bilaterally, no wheezes, rales, rhonchi Extremities: Mild generalized tenderness   Assessment: Encounter Diagnoses  Name Primary?  . Strep pharyngitis Yes  . Flu-like symptoms   . Sore throat      Plan: Advised that sore throat etiology appears to be bacterial.  Discussed symptoms, diagnosis, and possible complications including peritonsillar abscess formation.  Advised that they will be infectious for 24 hours after starting antibiotics.  Discussed means of prevention, precautions.  Supportive care recommended including OTC analgesics, salt water gargles, warm fluids, good hydration, and rest.   Discussed signs or symptoms that would prompt immediate evaluation.   Call or return if worse or not improving in the next 2-3 days.  Patient voiced understanding of diagnosis, recommendations, and treatment plan.  After visit summary given.  Gave note for work.  Andrew Harrington was seen today for possible flu.  Diagnoses and all orders for this visit:  Strep pharyngitis  Flu-like symptoms -     POC Influenza A&B(BINAX/QUICKVUE)  Sore throat  Other orders -     amoxicillin (AMOXIL) 500 MG capsule; Take 1 capsule (500 mg total) by mouth 3 (three) times daily.

## 2016-10-21 ENCOUNTER — Encounter (HOSPITAL_COMMUNITY): Payer: Self-pay | Admitting: Emergency Medicine

## 2016-10-21 ENCOUNTER — Ambulatory Visit (HOSPITAL_COMMUNITY)
Admission: EM | Admit: 2016-10-21 | Discharge: 2016-10-21 | Disposition: A | Discharge status: Home / Self Care | Payer: BC Managed Care – PPO | Attending: Family Medicine | Admitting: Family Medicine

## 2016-10-21 DIAGNOSIS — R519 Headache, unspecified: Secondary | ICD-10-CM

## 2016-10-21 DIAGNOSIS — B349 Viral infection, unspecified: Secondary | ICD-10-CM

## 2016-10-21 DIAGNOSIS — R51 Headache: Secondary | ICD-10-CM

## 2016-10-21 MED ORDER — DEXAMETHASONE SODIUM PHOSPHATE 10 MG/ML IJ SOLN
INTRAMUSCULAR | Status: AC
  Filled 2016-10-21: qty 1

## 2016-10-21 MED ORDER — KETOROLAC TROMETHAMINE 60 MG/2ML IM SOLN
60.0000 mg | Freq: Once | INTRAMUSCULAR | Status: AC
  Administered 2016-10-21: 60 mg via INTRAMUSCULAR

## 2016-10-21 MED ORDER — ACETAMINOPHEN 325 MG PO TABS
ORAL_TABLET | ORAL | Status: AC
  Filled 2016-10-21: qty 2

## 2016-10-21 MED ORDER — KETOROLAC TROMETHAMINE 60 MG/2ML IM SOLN
INTRAMUSCULAR | Status: AC
  Filled 2016-10-21: qty 2

## 2016-10-21 MED ORDER — DEXAMETHASONE SODIUM PHOSPHATE 10 MG/ML IJ SOLN
10.0000 mg | Freq: Once | INTRAMUSCULAR | Status: AC
  Administered 2016-10-21: 10 mg via INTRAMUSCULAR

## 2016-10-21 MED ORDER — ACETAMINOPHEN 325 MG PO TABS
650.0000 mg | ORAL_TABLET | Freq: Once | ORAL | Status: AC
  Administered 2016-10-21: 650 mg via ORAL

## 2016-10-21 NOTE — Discharge Instructions (Signed)
The cause of your headache is uncertain. Potential causes include the fact that she recently had a viral illness and this is a remnant of those symptoms. Tension headache is a possibility. Although we do not know the exact cause of the headache with the medications given here will help and you are not showing any signs or telling me of any symptoms that are especially worrisome for an intracranial problem. If you are not better tomorrow recommend that she call your primary care provider. If you develop any of the symptoms that we discussed and that are listed on your headache instruction sheets or any abnormalities or concerns or worsening he should go directly to the emergency department.

## 2016-10-21 NOTE — ED Triage Notes (Signed)
Patient presents to [facility] today with a complaint of a headache since Thursday. Patient states that he was diagnosed with Strep on Weds and is currently taking an antibiotic. He states that he does have spells of feeling warm and reports chills.

## 2016-10-21 NOTE — ED Provider Notes (Signed)
CSN: [id number]     Arrival date & time 10/21/16  1218 History   First MD Initiated Contact with Patient 10/21/16 1448     Chief Complaint  Patient presents with  . 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 He states that 4 days ago he was diagnosed by a PCP with strep throat. He also had some minor symptoms similar to flulike illness. He tested positive for strep negative for flu symptoms. Approximate 2 days ago he developed a headache located in the bilateral upper forehead. Occasionally has some lightheadedness. There is no radiation of pain. He has taken ibuprofen with minimal in temporary relief but headache persists. He has also had some minor body aches as well as a previous sore throat but a currently no fevers. Denies problems with vision, speech, hearing, swallowing, focal paresthesias or weakness. Denies trouble with gait or coordination. Denies problems with cognition, memory, recall; confusion or disorientation. No history of trauma.      History reviewed. No pertinent past medical history. History reviewed. No pertinent surgical history. Family History  Problem Relation Age of Onset  . Diabetes Mother   . Aneurysm Father    Social History  Substance Use Topics  . Smoking status: Never Smoker  . Smokeless tobacco: Never Used  . Alcohol use Yes     Comment: social    Review of Systems  Constitutional: Negative.   HENT: Negative for congestion, ear pain, postnasal drip, rhinorrhea, sore throat, trouble swallowing and voice change.   Eyes: Negative.  Negative for photophobia, pain and visual disturbance.  Respiratory: Negative.   Cardiovascular: Negative.   Gastrointestinal: Negative.   Genitourinary: Negative.   Musculoskeletal: Negative.   Skin: Negative.   Neurological: Positive for light-headedness and headaches.  Negative for tremors, seizures, syncope, facial asymmetry, speech difficulty, weakness and numbness.  Psychiatric/Behavioral: Negative.   All other systems reviewed and are negative.   Allergies  Patient has no known allergies.  Home Medications   Prior to Admission medications   Medication Sig Start Date End Date Taking? Authorizing Provider  amoxicillin (AMOXIL) 500 MG capsule Take 1 capsule (500 mg total) by mouth 3 (three) times daily. 10/17/16   Camelia Eng Tysinger, PA-C  emtricitabine-tenofovir (TRUVADA) 200-300 MG tablet TAKE ONE TABLET BY MOUTH ONCE DAILY WITH OR WITHOUT FOOD. STORE IN ORIGINAL CONTAINER AT ROOM TEMPERATURE. 10/15/16   Girtha Rm, NP  Multiple Vitamin (MULTIVITAMIN) tablet Take 1 tablet by mouth daily.    Historical Provider, MD  sildenafil (VIAGRA) 100 MG tablet Take 0.5-1 tablets (50-100 mg total) by mouth daily as needed for erectile dysfunction. 09/19/16   Girtha Rm, NP   Meds Ordered and Administered this Visit   Medications  ketorolac (TORADOL) injection 60 mg (not administered)  dexamethasone (DECADRON) injection 10 mg (10 mg Intramuscular Given 10/21/16 1517)  acetaminophen (TYLENOL) tablet 650 mg (650 mg Oral Given 10/21/16 1516)    BP 126/79 (BP Location: Right Arm)   Pulse 71   Temp 98.7 F (37.1 C) (Oral)   Resp 18   SpO2 100%  No data found.   Physical Exam  Constitutional: He is oriented to person, place, and time. He appears well-developed and well-nourished. No distress.  Patient is fully awake, oriented, smiling showing no signs of distress. Sitting on the end of the table for exam. Cooperative.  HENT:  Head: Normocephalic and atraumatic.  Mouth/Throat: Oropharynx is clear and moist.  No oropharyngeal exudate.  Bilateral TMs are mildly retracted otherwise normal. Oropharynx is clear. Soft palate rises symmetrically. Tongue and uvula are midline.  Eyes: Conjunctivae and EOM are normal. Pupils are equal, round, and reactive to light.   Neck: Normal range of motion. Neck supple.  Cardiovascular: Normal rate, regular rhythm, normal heart sounds and intact distal pulses.   Pulmonary/Chest: Effort normal and breath sounds normal. No respiratory distress. He has no wheezes. He has no rales.  Musculoskeletal: Normal range of motion. He exhibits no edema.  Lymphadenopathy:    He has no cervical adenopathy.  Neurological: He is alert and oriented to person, place, and time. He has normal strength. He displays no tremor. No cranial nerve deficit or sensory deficit. He exhibits normal muscle tone. He displays no seizure activity. Coordination and gait normal. GCS eye subscore is 4. GCS verbal subscore is 5. GCS motor subscore is 6.  Skin: Skin is warm and dry. Capillary refill takes less than 2 seconds. He is not diaphoretic.  Psychiatric: He has a normal mood and affect. His behavior is normal. Judgment and thought content normal.  Nursing note and vitals reviewed.   Urgent Care Course     Procedures (including critical care time)  Labs Review Labs Reviewed - No data to display  Imaging Review No results found.   Visual Acuity Review  Right Eye Distance:   Left Eye Distance:   Bilateral Distance:    Right Eye Near:   Left Eye Near:    Bilateral Near:         MDM   1. Acute nonintractable headache, unspecified headache type   2. Viral illness    The cause of your headache is uncertain. Potential causes include the fact that she recently had a viral illness and this is a remnant of those symptoms. Tension headache is a possibility. Although we do not know the exact cause of the headache with the medications given here will help and you are not showing any signs or telling me of any symptoms that are especially worrisome for an intracranial problem. If you are not better tomorrow recommend that she call your primary care provider. If you develop any of the symptoms that we discussed and that are listed on your  headache instruction sheets or any abnormalities or concerns or worsening he should go directly to the emergency department.    Janne Napoleon, NP 10/21/16 [phone]

## 2016-10-22 ENCOUNTER — Other Ambulatory Visit (INDEPENDENT_AMBULATORY_CARE_PROVIDER_SITE_OTHER): Payer: BC Managed Care – PPO

## 2016-10-22 DIAGNOSIS — Z23 Encounter for immunization: Secondary | ICD-10-CM

## 2016-12-27 ENCOUNTER — Ambulatory Visit: Payer: BC Managed Care – PPO | Admitting: Family Medicine

## 2016-12-27 ENCOUNTER — Encounter: Payer: Self-pay | Admitting: Internal Medicine

## 2016-12-27 ENCOUNTER — Other Ambulatory Visit: Payer: BC Managed Care – PPO

## 2016-12-27 DIAGNOSIS — Z79899 Other long term (current) drug therapy: Secondary | ICD-10-CM

## 2016-12-28 ENCOUNTER — Other Ambulatory Visit: Payer: Self-pay | Admitting: Family Medicine

## 2016-12-28 LAB — HIV ANTIBODY (ROUTINE TESTING W REFLEX): HIV: NONREACTIVE

## 2016-12-28 MED ORDER — EMTRICITABINE-TENOFOVIR DF 200-300 MG PO TABS: ORAL_TABLET | ORAL | 0 refills | Status: DC

## 2017-03-28 ENCOUNTER — Other Ambulatory Visit: Payer: Self-pay | Admitting: Family Medicine

## 2017-03-28 DIAGNOSIS — Z7251 High risk heterosexual behavior: Secondary | ICD-10-CM

## 2017-03-28 NOTE — Telephone Encounter (Signed)
Looks like he needs HIV, RPR and all STD testing prior to refilling this. Also, please check and see if he completed the hep B series.

## 2017-03-28 NOTE — Telephone Encounter (Signed)
Is this okay to refill? 

## 2017-03-28 NOTE — Telephone Encounter (Signed)
Pt coming in [place] for appt and we can do testing then

## 2017-03-29 ENCOUNTER — Ambulatory Visit (INDEPENDENT_AMBULATORY_CARE_PROVIDER_SITE_OTHER): Payer: BC Managed Care – PPO | Admitting: Family Medicine

## 2017-03-29 ENCOUNTER — Encounter: Payer: Self-pay | Admitting: Family Medicine

## 2017-03-29 VITALS — BP 124/84 | HR 78 | Wt 297.4 lb

## 2017-03-29 DIAGNOSIS — Z833 Family history of diabetes mellitus: Secondary | ICD-10-CM | POA: Diagnosis not present

## 2017-03-29 DIAGNOSIS — Z7251 High risk heterosexual behavior: Secondary | ICD-10-CM | POA: Diagnosis not present

## 2017-03-29 DIAGNOSIS — Z79899 Other long term (current) drug therapy: Secondary | ICD-10-CM

## 2017-03-29 DIAGNOSIS — E782 Mixed hyperlipidemia: Secondary | ICD-10-CM | POA: Diagnosis not present

## 2017-03-29 DIAGNOSIS — E669 Obesity, unspecified: Secondary | ICD-10-CM | POA: Diagnosis not present

## 2017-03-29 LAB — BASIC METABOLIC PANEL
BUN: 12 mg/dL (ref 7–25)
CO2: 24 mmol/L (ref 20–32)
CREATININE: 1.01 mg/dL (ref 0.60–1.35)
Calcium: 9.6 mg/dL (ref 8.6–10.3)
Chloride: 100 mmol/L (ref 98–110)
Glucose, Bld: 91 mg/dL (ref 65–99)
POTASSIUM: 4.4 mmol/L (ref 3.5–5.3)
Sodium: 134 mmol/L — ABNORMAL LOW (ref 135–146)

## 2017-03-29 LAB — LIPID PANEL
CHOL/HDL RATIO: 4.9 ratio (ref <5.0)
Cholesterol: 202 mg/dL — ABNORMAL HIGH (ref <200)
HDL: 41 mg/dL (ref >40)
LDL CALC: 125 mg/dL — AB (ref <100)
Triglycerides: 182 mg/dL — ABNORMAL HIGH (ref <150)
VLDL: 36 mg/dL — ABNORMAL HIGH (ref <30)

## 2017-03-29 NOTE — Progress Notes (Signed)
   Subjective:    Patient ID: Andrew Harrington, male    DOB: 12-10-1975, 41 y.o.   MRN: [id number]  HPI Chief Complaint  Patient presents with  . 3 month follow-up    3 month follow-up   He is here to follow up on PrEP and to have STD testing.   States he is takingTruvada daily without any issues. He is happy with his sex life.   Reviewed labs with patient and he is aware that he has hyperlipdemia. He is fasting and would like to recheck his lipids.   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   He has one more injection for Hep B series.   Denies fever, chills, dizziness, chest pain, palpitations, shortness of breath, GI or GU issues.   Reviewed allergies, medications, past medical, surgical, and social history.   Review of Systems Pertinent positives and negatives in the history of present illness.     Objective:   Physical Exam BP 124/84   Pulse 78   Wt 297 lb 6.4 oz (134.9 kg)   BMI 38.18 kg/m   Alert and oriented and in no acute distress. Not otherwise examined.       Assessment & Plan:  Medication management - Plan: Basic metabolic panel, RPR, GC/Chlamydia Probe Amp, HIV antibody  Mixed hyperlipidemia - Plan: Lipid panel  Obesity (BMI 30-39.9) - Plan: Basic metabolic panel, Lipid panel  High risk sexual behavior - Plan: RPR, GC/Chlamydia Probe Amp, HIV antibody  Family history of diabetes mellitus (DM) - Plan: Basic metabolic panel  He is doing well and reports good compliance on Truvada. He is in good spirits.  Counseled on healthy diet and exercise for hyperlipidemia and obesity.  Will check labs and follow up. Refill Truvada once labs resulted.  He is due for the final Hep B injection in the series next month.

## 2017-03-30 LAB — HIV ANTIBODY (ROUTINE TESTING W REFLEX): HIV 1&2 Ab, 4th Generation: NONREACTIVE

## 2017-03-30 LAB — GC/CHLAMYDIA PROBE AMP
CT Probe RNA: NOT DETECTED
GC PROBE AMP APTIMA: NOT DETECTED

## 2017-03-30 LAB — RPR

## 2017-03-31 ENCOUNTER — Other Ambulatory Visit: Payer: Self-pay | Admitting: Family Medicine

## 2017-03-31 MED ORDER — EMTRICITABINE-TENOFOVIR DF 200-300 MG PO TABS: ORAL_TABLET | ORAL | 0 refills | Status: DC

## 2017-03-31 NOTE — Progress Notes (Signed)
   Subjective:    Patient ID: Andrew Harrington, male    DOB: 1976-04-21, 41 y.o.   MRN: [id number]  HPI    Review of Systems     Objective:   Physical Exam        Assessment & Plan:

## 2017-04-01 NOTE — Telephone Encounter (Signed)
Please put in future orders for pt to come back in 3 months for testing.

## 2017-04-01 NOTE — Telephone Encounter (Signed)
He will just need HIV in 3 months

## 2017-04-01 NOTE — Telephone Encounter (Signed)
He will just need HIV in 3 months. Thanks.

## 2017-04-23 ENCOUNTER — Other Ambulatory Visit (INDEPENDENT_AMBULATORY_CARE_PROVIDER_SITE_OTHER): Payer: BC Managed Care – PPO

## 2017-04-23 DIAGNOSIS — Z23 Encounter for immunization: Secondary | ICD-10-CM | POA: Diagnosis not present

## 2017-06-19 ENCOUNTER — Other Ambulatory Visit: Payer: Self-pay | Admitting: Family Medicine

## 2017-06-28 ENCOUNTER — Encounter: Payer: Self-pay | Admitting: Family Medicine

## 2017-06-28 ENCOUNTER — Other Ambulatory Visit: Payer: BC Managed Care – PPO

## 2017-06-28 DIAGNOSIS — Z7251 High risk heterosexual behavior: Secondary | ICD-10-CM

## 2017-06-29 LAB — HIV ANTIBODY (ROUTINE TESTING W REFLEX): HIV 1&2 Ab, 4th Generation: NONREACTIVE

## 2017-07-01 ENCOUNTER — Other Ambulatory Visit: Payer: Self-pay | Admitting: Family Medicine

## 2017-07-01 MED ORDER — EMTRICITABINE-TENOFOVIR DF 200-300 MG PO TABS
ORAL_TABLET | ORAL | 0 refills | Status: DC
Start: 2017-07-01 — End: 2017-09-30

## 2017-09-19 ENCOUNTER — Other Ambulatory Visit: Payer: Self-pay | Admitting: Family Medicine

## 2017-09-19 NOTE — Telephone Encounter (Signed)
He is on the lab visit for a 3 month lab visit. No future orders have been put in. Please put in order

## 2017-09-27 ENCOUNTER — Other Ambulatory Visit: Payer: BC Managed Care – PPO

## 2017-09-27 DIAGNOSIS — Z7251 High risk heterosexual behavior: Secondary | ICD-10-CM

## 2017-09-28 LAB — HIV ANTIBODY (ROUTINE TESTING W REFLEX): HIV SCREEN 4TH GENERATION: NONREACTIVE

## 2017-09-30 ENCOUNTER — Other Ambulatory Visit: Payer: Self-pay | Admitting: Family Medicine

## 2017-09-30 DIAGNOSIS — Z7251 High risk heterosexual behavior: Secondary | ICD-10-CM

## 2017-09-30 MED ORDER — EMTRICITABINE-TENOFOVIR DF 200-300 MG PO TABS: ORAL_TABLET | ORAL | 0 refills | Status: DC

## 2017-12-12 ENCOUNTER — Other Ambulatory Visit: Payer: Self-pay | Admitting: Family Medicine

## 2017-12-12 NOTE — Telephone Encounter (Signed)
Pt has an appt on 5/8 for hiv recheck and he shouldn't run out until 5/11.

## 2017-12-23 ENCOUNTER — Encounter: Payer: Self-pay | Admitting: Family Medicine

## 2017-12-23 ENCOUNTER — Other Ambulatory Visit: Payer: BC Managed Care – PPO

## 2017-12-23 DIAGNOSIS — Z7251 High risk heterosexual behavior: Secondary | ICD-10-CM

## 2017-12-24 ENCOUNTER — Other Ambulatory Visit: Payer: Self-pay | Admitting: Family Medicine

## 2017-12-24 DIAGNOSIS — Z7251 High risk heterosexual behavior: Secondary | ICD-10-CM

## 2017-12-24 LAB — RPR: RPR Ser Ql: NONREACTIVE

## 2017-12-24 LAB — HIV ANTIBODY (ROUTINE TESTING W REFLEX): HIV Screen 4th Generation wRfx: NONREACTIVE

## 2017-12-24 MED ORDER — EMTRICITABINE-TENOFOVIR DF 200-300 MG PO TABS: ORAL_TABLET | ORAL | 0 refills | Status: DC

## 2017-12-25 ENCOUNTER — Other Ambulatory Visit: Payer: BC Managed Care – PPO

## 2018-03-23 NOTE — Progress Notes (Signed)
Subjective:    Patient ID: Andrew Harrington, male    DOB: 07-25-1976, 42 y.o.   MRN: [id number]  HPI Chief Complaint  Patient presents with  . fasting cpe    fasting cpe/. no other concerns   He is here for a complete physical exam. Last CPE: 03/2016  No concerns today.   He is taking Truvada for PrEP and using condoms. Reports in a good relationship.   Weight has been stable for years. Aware of obesity.   Other providers:  Dermatologist- Dr. Allyson Sabal   Social history: Lives with his male partner,  works as a Pharmacist, hospital.  Denies smoking, occasional drinking alcohol, drug use Diet: fairly healthy  Exercise: not very often   Immunizations: up to date   Health maintenance:  Colonoscopy: never  Last PSA: never  Last Dental Exam: twice annually, appt next week Last Eye Exam: last November   Wears seatbelt always, uses sunscreen, smoke detectors in home and functioning, does not text while driving, feels safe in home environment.  Reviewed allergies, medications, past medical, surgical, family, and social history.    Review of Systems Review of Systems Constitutional: -fever, -chills, -sweats, -unexpected weight change,-fatigue ENT: -runny nose, -ear pain, -sore throat Cardiology:  -chest pain, -palpitations, -edema Respiratory: -cough, -shortness of breath, -wheezing Gastroenterology: -abdominal pain, -nausea, -vomiting, -diarrhea, -constipation  Hematology: -bleeding or bruising problems Musculoskeletal: -arthralgias, -myalgias, -joint swelling, -back pain Ophthalmology: -vision changes Urology: -dysuria, -difficulty urinating, -hematuria, -urinary frequency, -urgency Neurology: -headache, -weakness, -tingling, -numbness      Objective:   Physical Exam BP 124/80   Pulse 83   Ht 6\' 3"  (1.905 m)   Wt (!) 310 lb 9.6 oz (140.9 kg)   BMI 38.82 kg/m   General Appearance:    Alert, cooperative, no distress, appears stated age  Head:    Normocephalic, without obvious  abnormality, atraumatic  Eyes:    PERRL, conjunctiva/corneas clear, EOM's intact, fundi    benign  Ears:    Normal TM's and external ear canals  Nose:   Nares normal, mucosa normal, no drainage or sinus   tenderness  Throat:   Lips, mucosa, and tongue normal; teeth and gums normal  Neck:   Supple, no lymphadenopathy;  thyroid:  no   enlargement/tenderness/nodules; no carotid   bruit or JVD  Back:    Spine nontender, no curvature, ROM normal, no CVA     tenderness  Lungs:     Clear to auscultation bilaterally without wheezes, rales or     ronchi; respirations unlabored  Chest Wall:    No tenderness or deformity   Heart:    Regular rate and rhythm, S1 and S2 normal, no murmur, rub   or gallop  Breast Exam:    No chest wall tenderness, masses. gynecomastia  Abdomen:     Soft, non-tender, nondistended, normoactive bowel sounds,    no masses, no hepatosplenomegaly  Genitalia:    Deferred.   Rectal:   Deferred due to age <40 and lack of symptoms  Extremities:   No clubbing, cyanosis or edema  Pulses:   2+ and symmetric all extremities  Skin:   Skin color, texture, turgor normal, no rashes or lesions. Multiple nevi.   Lymph nodes:   Cervical, supraclavicular, and axillary nodes normal  Neurologic:   CNII-XII intact, normal strength, sensation and gait; reflexes 2+ and symmetric throughout          Psych:   Normal mood, affect, hygiene and grooming.  Urinalysis dipstick: trace protein, otherwise negative      Assessment & Plan:  Routine general medical examination at a health care facility - Plan: POCT Urinalysis DIP (Proadvantage Device), CBC with Differential/Platelet, Comprehensive metabolic panel, TSH, Lipid panel  Mixed hyperlipidemia - Plan: Lipid panel  High risk sexual behavior, unspecified type - Plan: RPR, HIV antibody, GC/Chlamydia Probe Amp  Obesity (BMI 35.0-39.9 without comorbidity) - Plan: TSH, Lipid panel  Medication management - Plan: HIV antibody, GC/Chlamydia Probe  Amp  He appears to be doing well physically and emotionally.  He is in a new relationship and they recently moved in together.  Reports good compliance with Truvada.  Will check HIV, RPR, GC/CT and refill medication as appropriate.  He did complete his hep B series He is also using condoms. He is aware that his BMI places him in the obese category.  Weight has been stable over the past couple of years. Counseled on healthy diet and exercise.  He is starting back to work soon, he is a Pharmacist, hospital. Has enjoyed his summer off.  Advised elevated lipids. Will check fasting lipids today.  Immunizations up to date.  Discussed importance of using sunscreen.  He does report doing self testicular exams and no concerns.  Follow up pending labs.

## 2018-03-24 ENCOUNTER — Other Ambulatory Visit: Payer: Self-pay | Admitting: Family Medicine

## 2018-03-24 ENCOUNTER — Encounter: Payer: Self-pay | Admitting: Family Medicine

## 2018-03-24 ENCOUNTER — Ambulatory Visit: Payer: BC Managed Care – PPO | Admitting: Family Medicine

## 2018-03-24 VITALS — BP 124/80 | HR 83 | Ht 75.0 in | Wt 310.6 lb

## 2018-03-24 DIAGNOSIS — Z0000 Encounter for general adult medical examination without abnormal findings: Secondary | ICD-10-CM

## 2018-03-24 DIAGNOSIS — E782 Mixed hyperlipidemia: Secondary | ICD-10-CM | POA: Diagnosis not present

## 2018-03-24 DIAGNOSIS — Z79899 Other long term (current) drug therapy: Secondary | ICD-10-CM

## 2018-03-24 DIAGNOSIS — Z7251 High risk heterosexual behavior: Secondary | ICD-10-CM | POA: Diagnosis not present

## 2018-03-24 DIAGNOSIS — E669 Obesity, unspecified: Secondary | ICD-10-CM | POA: Diagnosis not present

## 2018-03-24 LAB — POCT URINALYSIS DIP (PROADVANTAGE DEVICE)
Bilirubin, UA: NEGATIVE
Blood, UA: NEGATIVE
Glucose, UA: NEGATIVE mg/dL
Ketones, POC UA: NEGATIVE mg/dL
Leukocytes, UA: NEGATIVE
Nitrite, UA: NEGATIVE
Specific Gravity, Urine: 1.025
UUROB: NEGATIVE
pH, UA: 6 (ref 5.0–8.0)

## 2018-03-24 NOTE — Telephone Encounter (Signed)
Will wait until the results come back tomorrow to refill

## 2018-03-24 NOTE — Patient Instructions (Signed)
Preventative Care for Adults, Male       REGULAR HEALTH EXAMS:  A routine yearly physical is a good way to check in with your primary care provider about your health and preventive screening. It is also an opportunity to share updates about your health and any concerns you have, and receive a thorough all-over exam.   Most health insurance companies pay for at least some preventative services.  Check with your health plan for specific coverages.  WHAT PREVENTATIVE SERVICES DO MEN NEED?  Adult men should have their weight and blood pressure checked regularly.   Men age 35 and older should have their cholesterol levels checked regularly.  Beginning at age 50 and continuing to age 75, men should be screened for colorectal cancer.  Certain people should may need continued testing until age 85.  Other cancer screening may include exams for testicular and prostate cancer.  Updating vaccinations is part of preventative care.  Vaccinations help protect against diseases such as the flu.  Lab tests are generally done as part of preventative care to screen for anemia and blood disorders, to screen for problems with the kidneys and liver, to screen for bladder problems, to check blood sugar, and to check your cholesterol level.  Preventative services generally include counseling about diet, exercise, avoiding tobacco, drugs, excessive alcohol consumption, and sexually transmitted infections.    GENERAL RECOMMENDATIONS FOR GOOD HEALTH:  Healthy diet:  Eat a variety of foods, including fruit, vegetables, animal or vegetable protein, such as meat, fish, chicken, and eggs, or beans, lentils, tofu, and grains, such as rice.  Drink plenty of water daily.  Decrease saturated fat in the diet, avoid lots of red meat, processed foods, sweets, fast foods, and fried foods.  Exercise:  Aerobic exercise helps maintain good heart health. At least 30-40 minutes of moderate-intensity exercise is recommended.  For example, a brisk walk that increases your heart rate and breathing. This should be done on most days of the week.   Find a type of exercise or a variety of exercises that you enjoy so that it becomes a part of your daily life.  Examples are running, walking, swimming, water aerobics, and biking.  For motivation and support, explore group exercise such as aerobic class, spin class, Zumba, Yoga,or  martial arts, etc.    Set exercise goals for yourself, such as a certain weight goal, walk or run in a race such as a 5k walk/run.  Speak to your primary care provider about exercise goals.  Disease prevention:  If you smoke or chew tobacco, find out from your caregiver how to quit. It can literally save your life, no matter how long you have been a tobacco user. If you do not use tobacco, never begin.   Maintain a healthy diet and normal weight. Increased weight leads to problems with blood pressure and diabetes.   The Body Mass Index or BMI is a way of measuring how much of your body is fat. Having a BMI above 27 increases the risk of heart disease, diabetes, hypertension, stroke and other problems related to obesity. Your caregiver can help determine your BMI and based on it develop an exercise and dietary program to help you achieve or maintain this important measurement at a healthful level.  High blood pressure causes heart and blood vessel problems.  Persistent high blood pressure should be treated with medicine if weight loss and exercise do not work.   Fat and cholesterol leaves deposits in your arteries   that can block them. This causes heart disease and vessel disease elsewhere in your body.  If your cholesterol is found to be high, or if you have heart disease or certain other medical conditions, then you may need to have your cholesterol monitored frequently and be treated with medication.   Ask if you should have a stress test if your history suggests this. A stress test is a test done on  a treadmill that looks for heart disease. This test can find disease prior to there being a problem.  Avoid drinking alcohol in excess (more than two drinks per day).  Avoid use of street drugs. Do not share needles with anyone. Ask for professional help if you need assistance or instructions on stopping the use of alcohol, cigarettes, and/or drugs.  Brush your teeth twice a day with fluoride toothpaste, and floss once a day. Good oral hygiene prevents tooth decay and gum disease. The problems can be painful, unattractive, and can cause other health problems. Visit your dentist for a routine oral and dental check up and preventive care every 6-12 months.   Look at your skin regularly.  Use a mirror to look at your back. Notify your caregivers of changes in moles, especially if there are changes in shapes, colors, a size larger than a pencil eraser, an irregular border, or development of new moles.  Safety:  Use seatbelts 100% of the time, whether driving or as a passenger.  Use safety devices such as hearing protection if you work in environments with loud noise or significant background noise.  Use safety glasses when doing any work that could send debris in to the eyes.  Use a helmet if you ride a bike or motorcycle.  Use appropriate safety gear for contact sports.  Talk to your caregiver about gun safety.  Use sunscreen with a SPF (or skin protection factor) of 15 or greater.  Lighter skinned people are at a greater risk of skin cancer. Don't forget to also wear sunglasses in order to protect your eyes from too much damaging sunlight. Damaging sunlight can accelerate cataract formation.   Practice safe sex. Use condoms. Condoms are used for birth control and to help reduce the spread of sexually transmitted infections (or STIs).  Some of the STIs are gonorrhea (the clap), chlamydia, syphilis, trichomonas, herpes, HPV (human papilloma virus) and HIV (human immunodeficiency virus) which causes AIDS.  The herpes, HIV and HPV are viral illnesses that have no cure. These can result in disability, cancer and death.   Keep carbon monoxide and smoke detectors in your home functioning at all times. Change the batteries every 6 months or use a model that plugs into the wall.   Vaccinations:  Stay up to date with your tetanus shots and other required immunizations. You should have a booster for tetanus every 10 years. Be sure to get your flu shot every year, since 5%-20% of the [place] population comes down with the flu. The flu vaccine changes each year, so being vaccinated once is not enough. Get your shot in the fall, before the flu season peaks.   Other vaccines to consider:  Pneumococcal vaccine to protect against certain types of pneumonia.  This is normally recommended for adults age 65 or older.  However, adults younger than 40 years old with certain underlying conditions such as diabetes, heart or lung disease should also receive the vaccine.  Shingles vaccine to protect against Varicella Zoster if you are older than age 60, or younger   than 40 years old with certain underlying illness.  Hepatitis A vaccine to protect against a form of infection of the liver by a virus acquired from food.  Hepatitis B vaccine to protect against a form of infection of the liver by a virus acquired from blood or body fluids, particularly if you work in health care.  If you plan to travel internationally, check with your local health department for specific vaccination recommendations.  Cancer Screening:  Most routine colon cancer screening begins at the age of 50. On a yearly basis, doctors may provide special easy to use take-home tests to check for hidden blood in the stool. Sigmoidoscopy or colonoscopy can detect the earliest forms of colon cancer and is life saving. These tests use a small camera at the end of a tube to directly examine the colon. Speak to your caregiver about this at age 50, when routine  screening begins (and is repeated every 5 years unless early forms of pre-cancerous polyps or small growths are found).   At the age of 50 men usually start screening for prostate cancer every year. Screening may begin at a younger age for those with higher risk. Those at higher risk include African-Americans or having a family history of prostate cancer. There are two types of tests for prostate cancer:   Prostate-specific antigen (PSA) testing. Recent studies raise questions about prostate cancer using PSA and you should discuss this with your caregiver.   Digital rectal exam (in which your doctor's lubricated and gloved finger feels for enlargement of the prostate through the anus).   Screening for testicular cancer.  Do a monthly exam of your testicles. Gently roll each testicle between your thumb and fingers, feeling for any abnormal lumps. The best time to do this is after a hot shower or bath when the tissues are looser. Notify your caregivers of any lumps, tenderness or changes in size or shape immediately.     

## 2018-03-25 LAB — CBC WITH DIFFERENTIAL/PLATELET
BASOS ABS: 0 10*3/uL (ref 0.0–0.2)
Basos: 0 %
EOS (ABSOLUTE): 0.2 10*3/uL (ref 0.0–0.4)
Eos: 3 %
HEMOGLOBIN: 16.2 g/dL (ref 13.0–17.7)
Hematocrit: 45 % (ref 37.5–51.0)
IMMATURE GRANULOCYTES: 0 %
Immature Grans (Abs): 0 10*3/uL (ref 0.0–0.1)
LYMPHS: 22 %
Lymphocytes Absolute: 1.6 10*3/uL (ref 0.7–3.1)
MCH: 32.1 pg (ref 26.6–33.0)
MCHC: 36 g/dL — ABNORMAL HIGH (ref 31.5–35.7)
MCV: 89 fL (ref 79–97)
MONOCYTES: 5 %
Monocytes Absolute: 0.3 10*3/uL (ref 0.1–0.9)
Neutrophils Absolute: 5.1 10*3/uL (ref 1.4–7.0)
Neutrophils: 70 %
Platelets: 300 10*3/uL (ref 150–450)
RBC: 5.04 x10E6/uL (ref 4.14–5.80)
RDW: 13.6 % (ref 12.3–15.4)
WBC: 7.3 10*3/uL (ref 3.4–10.8)

## 2018-03-25 LAB — HIV ANTIBODY (ROUTINE TESTING W REFLEX): HIV Screen 4th Generation wRfx: NONREACTIVE

## 2018-03-25 LAB — RPR: RPR: NONREACTIVE

## 2018-03-25 LAB — COMPREHENSIVE METABOLIC PANEL
ALBUMIN: 4.3 g/dL (ref 3.5–5.5)
ALT: 17 IU/L (ref 0–44)
AST: 13 IU/L (ref 0–40)
Albumin/Globulin Ratio: 1.4 (ref 1.2–2.2)
Alkaline Phosphatase: 89 IU/L (ref 39–117)
BUN / CREAT RATIO: 11 (ref 9–20)
BUN: 11 mg/dL (ref 6–24)
Bilirubin Total: 0.5 mg/dL (ref 0.0–1.2)
CO2: 24 mmol/L (ref 20–29)
CREATININE: 1.01 mg/dL (ref 0.76–1.27)
Calcium: 9.6 mg/dL (ref 8.7–10.2)
Chloride: 99 mmol/L (ref 96–106)
GFR calc non Af Amer: 93 mL/min/{1.73_m2} (ref >59)
GFR, EST AFRICAN AMERICAN: 107 mL/min/{1.73_m2} (ref >59)
GLUCOSE: 95 mg/dL (ref 65–99)
Globulin, Total: 3 g/dL (ref 1.5–4.5)
Potassium: 4.6 mmol/L (ref 3.5–5.2)
Sodium: 137 mmol/L (ref 134–144)
Total Protein: 7.3 g/dL (ref 6.0–8.5)

## 2018-03-25 LAB — LIPID PANEL
CHOLESTEROL TOTAL: 224 mg/dL — AB (ref 100–199)
Chol/HDL Ratio: 5.5 ratio — ABNORMAL HIGH (ref 0.0–5.0)
HDL: 41 mg/dL (ref >39)
LDL CALC: 125 mg/dL — AB (ref 0–99)
TRIGLYCERIDES: 289 mg/dL — AB (ref 0–149)
VLDL CHOLESTEROL CAL: 58 mg/dL — AB (ref 5–40)

## 2018-03-25 LAB — GC/CHLAMYDIA PROBE AMP
Chlamydia trachomatis, NAA: NEGATIVE
Neisseria gonorrhoeae by PCR: NEGATIVE

## 2018-03-25 LAB — TSH: TSH: 0.839 u[IU]/mL (ref 0.450–4.500)

## 2018-06-17 ENCOUNTER — Other Ambulatory Visit: Payer: Self-pay | Admitting: Family Medicine

## 2018-06-17 NOTE — Telephone Encounter (Signed)
Ok when he has routine HIV and STD testing.

## 2018-06-17 NOTE — Telephone Encounter (Signed)
Is this ok to refill?  

## 2018-06-30 ENCOUNTER — Other Ambulatory Visit: Payer: BC Managed Care – PPO

## 2018-06-30 DIAGNOSIS — Z7251 High risk heterosexual behavior: Secondary | ICD-10-CM

## 2018-07-01 ENCOUNTER — Encounter: Payer: Self-pay | Admitting: Family Medicine

## 2018-07-01 ENCOUNTER — Other Ambulatory Visit: Payer: Self-pay | Admitting: Internal Medicine

## 2018-07-01 LAB — HIV ANTIBODY (ROUTINE TESTING W REFLEX): HIV Screen 4th Generation wRfx: NONREACTIVE

## 2018-07-01 MED ORDER — EMTRICITABINE-TENOFOVIR DF 200-300 MG PO TABS: ORAL_TABLET | ORAL | 0 refills | Status: DC

## 2018-09-08 ENCOUNTER — Ambulatory Visit: Payer: BC Managed Care – PPO | Admitting: Family Medicine

## 2018-09-08 ENCOUNTER — Encounter: Payer: Self-pay | Admitting: Family Medicine

## 2018-09-08 VITALS — BP 122/88 | HR 72 | Temp 99.1°F | Ht 75.0 in | Wt 319.0 lb

## 2018-09-08 DIAGNOSIS — M545 Low back pain, unspecified: Secondary | ICD-10-CM

## 2018-09-08 DIAGNOSIS — R51 Headache: Secondary | ICD-10-CM | POA: Diagnosis not present

## 2018-09-08 DIAGNOSIS — R52 Pain, unspecified: Secondary | ICD-10-CM | POA: Diagnosis not present

## 2018-09-08 DIAGNOSIS — R509 Fever, unspecified: Secondary | ICD-10-CM

## 2018-09-08 DIAGNOSIS — R519 Headache, unspecified: Secondary | ICD-10-CM

## 2018-09-08 LAB — POCT URINALYSIS DIP (PROADVANTAGE DEVICE)
BILIRUBIN UA: NEGATIVE
Blood, UA: NEGATIVE
GLUCOSE UA: NEGATIVE mg/dL
Ketones, POC UA: NEGATIVE mg/dL
LEUKOCYTES UA: NEGATIVE
Nitrite, UA: NEGATIVE
PH UA: 6 (ref 5.0–8.0)
Specific Gravity, Urine: 1.025
Urobilinogen, Ur: NEGATIVE

## 2018-09-08 LAB — POC INFLUENZA A&B (BINAX/QUICKVUE)
INFLUENZA B, POC: NEGATIVE
Influenza A, POC: NEGATIVE

## 2018-09-08 NOTE — Progress Notes (Signed)
Chief Complaint  Patient presents with  . Headache    and lbp x 1 week. Feels really run down. No cough or anything light that. No ear pain. Would like to know whether or not he has the flu and would like to be tested.    Last weekend he started with mild headache and discomfort in low back.  No URI symptoms.  Some intermittent lightheadedness and chills.  Not aware of any fever. 5 days ago he started feeing better, but later that night he started feeling worse again (backache and headache returned).    Headache is "in the middle" of the head, dull, tight.  Not stabbing/throbbing.  Headache is worse later in the day.  ES Tylenol helps with the headache.  Back pain is dull, low back, central, no radiation.  No numbness/tingling/weakness in legs. No urinary symptoms.  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  PMH, PSH, SH reviewed On Truvada, UTD on HIV screening  Outpatient Encounter Medications as of 09/08/2018  Medication Sig  . emtricitabine-tenofovir (TRUVADA) 200-300 MG tablet TAKE ONE TABLET BY MOUTH ONCE DAILY WITH OR WITHOUT FOOD. STORE IN ORIGINAL CONTAINER AT ROOM TEMPERATURE.  . Multiple Vitamin (MULTIVITAMIN) tablet Take 1 tablet by mouth daily.  . Omega-3 Fatty Acids (FISH OIL) 1000 MG CAPS Take 1 capsule by mouth daily.  . [DISCONTINUED] sildenafil (VIAGRA) 100 MG tablet Take 0.5-1 tablets (50-100 mg total) by mouth daily as needed for erectile dysfunction.   No facility-administered encounter medications on file as of 09/08/2018.    No Known Allergies  ROS: no known fever. +headache and mild mid-LBP per HPI. Denies sore throat, ear pain, cough. No chest pain, palpitations, nausea, vomiting, diarrhea. No urinary complaints. No other joint pains or other concerns. No bleeding, bruising, rashes.   PHYSICAL EXAM:  BP 122/88   Pulse 72   Temp 99.1 F (37.3 C) (Tympanic)   Ht 6\' 3"  (1.905 m)   Wt (!) 319 lb (144.7 kg)   BMI 39.87 kg/m    Well-appearing, pleasant male, in no distress HEENT: conjunctiva and sclera are clear, EOMI.  Nasal mucosa is mildly edematous with clear mucus. TM's and EAC's normal. OP is clear--mild erythema of anterior tonsillar pillars, tonsils are normal. Neck: no lymphadenopathy or mass Heart: regular rate and rhythm, no murmur Lungs: clear bilaterally Abdomen: soft, nontender, no mass Back: no spinal or CVA tenderness.  Area of discomfort is lower thoracic/upper lumbar spine Skin: many benign nevi, no rashes, cysts, lesions or erythema Neuro: alert and oriented, cranial nerves intact, normal gait Psych: normal mood, affect, hygiene and grooming   Urine dip: trace protein, otherwise normal Influenza A&B negative   ASSESSMENT/PLAN:  Body aches - Plan: POC Influenza A&B (Binax test)  Low back pain, unspecified back pain laterality, unspecified chronicity, unspecified whether sciatica present - unclear etiology; heat, tylenol prn. Return if persistent/worsening symptoms - Plan: POC Influenza A&B (Binax test), POCT Urinalysis DIP (Proadvantage Device)  Acute nonintractable headache, unspecified headache type - normal neuro exam; poss congestion/mild URI on exam. Cont tylenol prn. no e/o bacterial infection - Plan: POC Influenza A&B (Binax test)  Fever, unspecified fever cause - no evidence of acute infection on exam, except perhaps mild URI. Reassured.  Return for re-eval if persistent/worsening symptoms - Plan: POC Influenza A&B (Binax test)

## 2018-09-08 NOTE — Patient Instructions (Signed)
Continue to stay well hydrated, use tylenol as needed for fever or pain. You may use ibuprofen in addition, if needed. Contact us if any new symptoms develop. Hopefully this is just a virus that eventually will resolve. I see no source of infection (other than some congestion that you aren't reporting symptoms from).

## 2018-09-16 ENCOUNTER — Other Ambulatory Visit: Payer: Self-pay | Admitting: Family Medicine

## 2018-09-16 NOTE — Telephone Encounter (Signed)
Pt has an appt in February for labs. I will refill med til then

## 2018-09-18 ENCOUNTER — Encounter: Payer: Self-pay | Admitting: Family Medicine

## 2018-09-18 ENCOUNTER — Ambulatory Visit: Payer: BC Managed Care – PPO | Admitting: Family Medicine

## 2018-09-18 VITALS — BP 110/70 | HR 70 | Temp 98.3°F | Resp 16 | Wt 317.4 lb

## 2018-09-18 DIAGNOSIS — R5383 Other fatigue: Secondary | ICD-10-CM

## 2018-09-18 DIAGNOSIS — R51 Headache: Secondary | ICD-10-CM | POA: Diagnosis not present

## 2018-09-18 DIAGNOSIS — M545 Low back pain, unspecified: Secondary | ICD-10-CM

## 2018-09-18 DIAGNOSIS — R6883 Chills (without fever): Secondary | ICD-10-CM

## 2018-09-18 DIAGNOSIS — R61 Generalized hyperhidrosis: Secondary | ICD-10-CM | POA: Diagnosis not present

## 2018-09-18 DIAGNOSIS — R519 Headache, unspecified: Secondary | ICD-10-CM

## 2018-09-18 DIAGNOSIS — R809 Proteinuria, unspecified: Secondary | ICD-10-CM | POA: Diagnosis not present

## 2018-09-18 DIAGNOSIS — J029 Acute pharyngitis, unspecified: Secondary | ICD-10-CM | POA: Diagnosis not present

## 2018-09-18 DIAGNOSIS — G8929 Other chronic pain: Secondary | ICD-10-CM

## 2018-09-18 LAB — POCT URINALYSIS DIP (PROADVANTAGE DEVICE)
BILIRUBIN UA: NEGATIVE
Blood, UA: NEGATIVE
GLUCOSE UA: NEGATIVE mg/dL
Leukocytes, UA: NEGATIVE
Nitrite, UA: NEGATIVE
Protein Ur, POC: 30 mg/dL — AB
Specific Gravity, Urine: 1.02
pH, UA: 6 (ref 5.0–8.0)

## 2018-09-18 LAB — POCT RAPID STREP A (OFFICE): RAPID STREP A SCREEN: NEGATIVE

## 2018-09-18 LAB — POCT MONO (EPSTEIN BARR VIRUS): Mono, POC: NEGATIVE

## 2018-09-18 NOTE — Patient Instructions (Signed)
Your symptoms are non specific and it is unclear as to what is causing your symptoms at this point.  Continue staying well hydrated and you may take Tylenol or Ibuprofen if needed.   Let's check labs and follow up.  Return the 24 hour urine at your convenience.  Please call if you notice any new or worsening symptoms. Sometimes one new symptoms can give [place] more information to help determine your diagnosis.

## 2018-09-18 NOTE — Progress Notes (Signed)
Chief Complaint  Patient presents with  . sick    here last monday sick and no flu and was feeling better and then worse again. body aches, chills, low grade fever, headache    Subjective:  Andrew Harrington is a 43 y.o. male who presents for a 3 week history of an intermittent dull headache that he rates as a 1/10 or 2/10 at it's worst. He also has a back ache in the lower mid to low back region. Nothing aggravates this. It is intermittent. Non radiating. New onset of chills, night sweats, fatigue, loss of appetite. States his throat was hurting a couple of days ago.  Reports his eyelids have been "'puffy" the past 2 mornings. No vision changes or discharge. No eye pain.   Denies rhinorrhea, nasal congestion, sinus pressure, ear pain, post nasal drainage, cough, chest pain, palpitations, shortness of breath, abdominal pain, N/V/D, urinary symptoms.  He also denies LE edema.   Staying well hydrated. Not eating as much due to decreased appetite. No unexplained weight loss.   He was in to see my colleague 10 days ago and had a negative flu swab.  No obvious explanation for his symptoms at that time and supportive care was recommended.  Treatment to date: Tylenol extra strength 1g at bedtime.   Denies sick contacts.  No other aggravating or relieving factors.  No other c/o.  ROS as in subjective.   Objective: Vitals:   09/18/18 0809  BP: 110/70  Pulse: 70  Resp: 16  Temp: 98.3 F (36.8 C)  SpO2: 98%    General appearance: Alert, WD/WN, no distress, well appearing                             Skin: warm, dry, no rash, no pallor                            Head: no sinus tenderness                            Eyes: conjunctiva normal, corneas clear, PERRLA                            Ears: pearly TMs, external ear canals normal. Upper lids are somewhat edematous.  PERRLA, normal conjunctiva, EOMs intact                          Nose: septum midline, turbinates normal without discharge        Mouth/throat: MMM, tongue normal, mild pharyngeal erythema, no edema                           Neck: supple, no adenopathy, no thyromegaly, nontender                          Heart: RRR, normal S1, S2, no murmurs                         Lungs: CTA bilaterally, no wheezes, rales, or rhonchi   Back: normal sensation and motion. Non tender. Unable to reproduce pain.   Extremities: no edema.           Assessment: Fatigue, unspecified  type - Plan: CBC with Differential/Platelet, Comprehensive metabolic panel, HIV Antibody (routine testing w rflx), Hemoglobin A1c, TSH, T4, free, POCT Mono (Epstein Barr Virus)  Chills (without fever) - Plan: HIV Antibody (routine testing w rflx), TSH, T4, free, POCT Mono (Epstein Barr Virus)  Night sweats - Plan: HIV Antibody (routine testing w rflx), TSH, T4, free, POCT Mono (Epstein Barr Virus)  Chronic nonintractable headache, unspecified headache type  Proteinuria, unspecified type - Plan: POCT Urinalysis DIP (Proadvantage Device), Protein, urine, 24 hour  Acute pharyngitis, unspecified etiology - Plan: POCT rapid strep A  Acute midline low back pain without sciatica    Plan: Negative strep.  Negative mono.  Urinalysis dipstick: protein 1+ with history of proteinuria. Will order a 24 hour protein urine to quantify.  Discussed that his symptoms are nonspecific and no clear etiology at this point.  No sign of infectious process. No acute distress.  Tylenol or Ibuprofen OTC for aches and pain. Follow up pending labs and urine result. He will let me know if any new symptoms arise.

## 2018-09-19 LAB — COMPREHENSIVE METABOLIC PANEL
ALT: 95 IU/L — AB (ref 0–44)
AST: 67 IU/L — AB (ref 0–40)
Albumin/Globulin Ratio: 1.3 (ref 1.2–2.2)
Albumin: 4 g/dL (ref 4.0–5.0)
Alkaline Phosphatase: 104 IU/L (ref 39–117)
BUN/Creatinine Ratio: 7 — ABNORMAL LOW (ref 9–20)
BUN: 7 mg/dL (ref 6–24)
Bilirubin Total: 0.6 mg/dL (ref 0.0–1.2)
CALCIUM: 9.2 mg/dL (ref 8.7–10.2)
CO2: 24 mmol/L (ref 20–29)
CREATININE: 1.04 mg/dL (ref 0.76–1.27)
Chloride: 97 mmol/L (ref 96–106)
GFR calc Af Amer: 103 mL/min/{1.73_m2} (ref >59)
GFR, EST NON AFRICAN AMERICAN: 89 mL/min/{1.73_m2} (ref >59)
GLOBULIN, TOTAL: 3.2 g/dL (ref 1.5–4.5)
Glucose: 103 mg/dL — ABNORMAL HIGH (ref 65–99)
Potassium: 4.6 mmol/L (ref 3.5–5.2)
Sodium: 137 mmol/L (ref 134–144)
TOTAL PROTEIN: 7.2 g/dL (ref 6.0–8.5)

## 2018-09-19 LAB — CBC WITH DIFFERENTIAL/PLATELET
BASOS: 1 %
Basophils Absolute: 0.1 10*3/uL (ref 0.0–0.2)
EOS (ABSOLUTE): 0 10*3/uL (ref 0.0–0.4)
EOS: 1 %
HEMATOCRIT: 38.9 % (ref 37.5–51.0)
Hemoglobin: 13.7 g/dL (ref 13.0–17.7)
Immature Grans (Abs): 0 10*3/uL (ref 0.0–0.1)
Immature Granulocytes: 0 %
LYMPHS ABS: 3.4 10*3/uL — AB (ref 0.7–3.1)
Lymphs: 50 %
MCH: 29.3 pg (ref 26.6–33.0)
MCHC: 35.2 g/dL (ref 31.5–35.7)
MCV: 83 fL (ref 79–97)
MONOCYTES: 6 %
MONOS ABS: 0.4 10*3/uL (ref 0.1–0.9)
Neutrophils Absolute: 2.9 10*3/uL (ref 1.4–7.0)
Neutrophils: 42 %
Platelets: 277 10*3/uL (ref 150–450)
RBC: 4.67 x10E6/uL (ref 4.14–5.80)
RDW: 12.6 % (ref 11.6–15.4)
WBC: 6.9 10*3/uL (ref 3.4–10.8)

## 2018-09-19 LAB — TSH: TSH: 0.797 u[IU]/mL (ref 0.450–4.500)

## 2018-09-19 LAB — HEMOGLOBIN A1C
Est. average glucose Bld gHb Est-mCnc: 120 mg/dL
Hgb A1c MFr Bld: 5.8 % — ABNORMAL HIGH (ref 4.8–5.6)

## 2018-09-19 LAB — HIV ANTIBODY (ROUTINE TESTING W REFLEX): HIV Screen 4th Generation wRfx: NONREACTIVE

## 2018-09-19 LAB — T4, FREE: FREE T4: 1.42 ng/dL (ref 0.82–1.77)

## 2018-09-19 NOTE — Addendum Note (Signed)
Addended by: Minette Headland A on: 09/19/2018 09:28 AM   Modules accepted: Orders

## 2018-09-20 LAB — PROTEIN, URINE, 24 HOUR
Protein, 24H Urine: 238 mg/24 hr — ABNORMAL HIGH (ref 30–150)
Protein, Ur: 14.4 mg/dL

## 2018-09-21 LAB — HEPATITIS PANEL, ACUTE
HEP A IGM: NEGATIVE
Hep B C IgM: NEGATIVE
Hep C Virus Ab: <0.1 s/co ratio (ref 0.0–0.9)
Hepatitis B Surface Ag: NEGATIVE

## 2018-09-21 LAB — SPECIMEN STATUS REPORT

## 2018-09-25 ENCOUNTER — Ambulatory Visit: Payer: BC Managed Care – PPO | Admitting: Family Medicine

## 2018-09-25 ENCOUNTER — Encounter: Payer: Self-pay | Admitting: Family Medicine

## 2018-09-25 VITALS — BP 120/80 | HR 97

## 2018-09-25 DIAGNOSIS — R801 Persistent proteinuria, unspecified: Secondary | ICD-10-CM | POA: Diagnosis not present

## 2018-09-25 DIAGNOSIS — R748 Abnormal levels of other serum enzymes: Secondary | ICD-10-CM | POA: Insufficient documentation

## 2018-09-25 DIAGNOSIS — R7303 Prediabetes: Secondary | ICD-10-CM | POA: Insufficient documentation

## 2018-09-25 NOTE — Progress Notes (Signed)
   Subjective:    Patient ID: Andrew Harrington, male    DOB: 1976/02/23, 43 y.o.   MRN: [id number]  HPI Chief Complaint  Patient presents with  . discuss labs    discuss labs   He is here to discuss abnormal labs and elevated 24 hour protein. History of persistent proteinuria with normal serum creatinine.  At his previous visit on 09/18/2018 he was having intermittent dull headaches, nonspecific mid to lower back pain, chills, night sweats, fatigue and loss of appetite. His upper eyelids were edematous and throat was sore.  Today he reports the majority of his symptoms have resolved and he now only has fatigue and an occasional dull headache. Overall he feels reports feeling at least 75% improved.   No longer having back pain.   Denies fever, chills, dizziness, chest pain, palpitations, shortness of breath, abdominal pain, N/V/D, urinary symptoms, LE edema.  No early satiety or unexplained weight loss. Appetite has improved.   Elevated liver enzymes.  Does not drink alcohol. Has been taking more tylenol and ibuprofen recently for headache and back ache.  Discussed that he now has prediabetes.  Diet is not that healthy. States he can improve this. He has not been exercising.    Reviewed allergies, medications, past medical, surgical, family, and social history.    Review of Systems Pertinent positives and negatives in the history of present illness.     Objective:   Physical Exam Constitutional:      General: He is not in acute distress.    Appearance: Normal appearance.  HENT:     Mouth/Throat:     Mouth: Mucous membranes are moist.     Pharynx: Oropharynx is clear.  Eyes:     General: No scleral icterus.    Conjunctiva/sclera: Conjunctivae normal.     Pupils: Pupils are equal, round, and reactive to light.  Neck:     Musculoskeletal: Normal range of motion.  Pulmonary:     Effort: Pulmonary effort is normal.  Skin:    General: Skin is warm and dry.     Coloration:  Skin is not jaundiced or pale.  Neurological:     General: No focal deficit present.     Mental Status: He is alert and oriented to person, place, and time.    BP 120/80   Pulse 97        Assessment & Plan:  Elevated liver enzymes - Plan: Comprehensive metabolic panel, US Abdomen Limited RUQ  Prediabetes - Plan: Comprehensive metabolic panel  Persistent proteinuria - Plan: Protein electrophoresis, serum, Protein Electrophoresis, Urine Rflx.  Overall he reports feeling at least 75% improved.  Discussed that we will check more labs including spep, upep and repeat CMP due to persistent proteinuria.  Elevated liver enzymes- he does not drink alcohol. He is obese. Recheck CMP and send for RUQ [place].  Counseling on healthy diet and exercise for weight loss and to help with prediabetes.  Follow up pending labs and [place]. He will report back if any new or worsening symptoms arise, specifically if back pain returns.

## 2018-09-26 ENCOUNTER — Ambulatory Visit
Admission: RE | Admit: 2018-09-26 | Discharge: 2018-09-26 | Disposition: A | Discharge status: Home / Self Care | Payer: BC Managed Care – PPO | Source: Ambulatory Visit | Attending: Family Medicine | Admitting: Family Medicine

## 2018-09-26 DIAGNOSIS — R748 Abnormal levels of other serum enzymes: Secondary | ICD-10-CM

## 2018-09-27 LAB — PROTEIN ELECTROPHORESIS, URINE REFLEX
ALBUMIN ELP UR: 4.6 %
Alpha-1-Globulin, U: 3.6 %
Alpha-2-Globulin, U: 11.8 %
Beta Globulin, U: 37.3 %
Gamma Globulin, U: 42.6 %
Protein, Ur: 10.8 mg/dL

## 2018-09-27 LAB — COMPREHENSIVE METABOLIC PANEL
ALT: 174 IU/L — ABNORMAL HIGH (ref 0–44)
AST: 91 IU/L — ABNORMAL HIGH (ref 0–40)
Albumin/Globulin Ratio: 1.2 (ref 1.2–2.2)
Albumin: 3.6 g/dL — ABNORMAL LOW (ref 4.0–5.0)
Alkaline Phosphatase: 98 IU/L (ref 39–117)
BUN/Creatinine Ratio: 6 — ABNORMAL LOW (ref 9–20)
BUN: 6 mg/dL (ref 6–24)
Bilirubin Total: 0.4 mg/dL (ref 0.0–1.2)
CO2: 23 mmol/L (ref 20–29)
CREATININE: 0.95 mg/dL (ref 0.76–1.27)
Calcium: 8.5 mg/dL — ABNORMAL LOW (ref 8.7–10.2)
Chloride: 102 mmol/L (ref 96–106)
GFR calc Af Amer: 114 mL/min/{1.73_m2} (ref >59)
GFR calc non Af Amer: 99 mL/min/{1.73_m2} (ref >59)
GLUCOSE: 112 mg/dL — AB (ref 65–99)
Globulin, Total: 3 g/dL (ref 1.5–4.5)
Potassium: 4.6 mmol/L (ref 3.5–5.2)
Sodium: 141 mmol/L (ref 134–144)
Total Protein: 6.6 g/dL (ref 6.0–8.5)

## 2018-09-27 LAB — PROTEIN ELECTROPHORESIS, SERUM
A/G Ratio: 1 (ref 0.7–1.7)
ALPHA 1: 0.3 g/dL (ref 0.0–0.4)
ALPHA 2: 0.6 g/dL (ref 0.4–1.0)
Albumin ELP: 3.3 g/dL (ref 2.9–4.4)
Beta: 1.1 g/dL (ref 0.7–1.3)
Gamma Globulin: 1.4 g/dL (ref 0.4–1.8)
Globulin, Total: 3.3 g/dL (ref 2.2–3.9)
M-Spike, %: 0.3 g/dL — ABNORMAL HIGH

## 2018-09-29 ENCOUNTER — Other Ambulatory Visit: Payer: Self-pay | Admitting: Internal Medicine

## 2018-09-29 ENCOUNTER — Encounter: Payer: Self-pay | Admitting: Family Medicine

## 2018-09-29 ENCOUNTER — Encounter: Payer: Self-pay | Admitting: Internal Medicine

## 2018-09-29 DIAGNOSIS — R801 Persistent proteinuria, unspecified: Secondary | ICD-10-CM

## 2018-09-29 DIAGNOSIS — R748 Abnormal levels of other serum enzymes: Secondary | ICD-10-CM

## 2018-09-29 DIAGNOSIS — R9389 Abnormal findings on diagnostic imaging of other specified body structures: Secondary | ICD-10-CM

## 2018-09-29 DIAGNOSIS — R778 Other specified abnormalities of plasma proteins: Secondary | ICD-10-CM

## 2018-09-29 HISTORY — DX: Other specified abnormalities of plasma proteins: R77.8

## 2018-09-30 ENCOUNTER — Telehealth: Payer: Self-pay | Admitting: Hematology

## 2018-09-30 NOTE — Telephone Encounter (Signed)
A new hem appt has been scheduled for the pt to see Dr. Irene Limbo on 2/17 at 11am. Aware to arrive 30 minutes early to be checked in on time.

## 2018-10-02 ENCOUNTER — Encounter: Payer: Self-pay | Admitting: Family Medicine

## 2018-10-03 NOTE — Progress Notes (Signed)
HEMATOLOGY/ONCOLOGY CONSULTATION NOTE  Date of Service: 10/06/2018  Patient Care Team: Girtha Rm, NP-C as PCP - General (Family Medicine)  CHIEF COMPLAINTS/PURPOSE OF CONSULTATION:  Monoclonal Proteinemia   HISTORY OF PRESENTING ILLNESS:  Andrew Harrington is a wonderful 43 y.o. 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 The pt reports that he is doing well overall.   The pt reports that he has not had any past medical problems. He denies HTN, DM, and HLD. He denies taking chronic medications. The pt notes that he has recently been taking 644m Ibuprofen BID for the last week due to a recent wisdom tooth extraction. He endorses social alcohol use and denies any recreational drug use or cigarette smoking. He denies any family history of blood disorders or cancers.   The pt notes that he had a period of 3 weeks, roughly between 08/29/18 and 09/20/18, in which he felt like he had the flu, though sought medical evaluation, and the flu was ruled out. He notes that he had symptoms such as body aches, chills, headaches, and lower back pain. 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   The pt notes that he has been told he has protein in the urine for the past several years, and has not seen a nephrologist. He will be meeting with Dr. JScarlette Shortsin [facility] on 10/15/18. The pt denies any abdominal pains. The pt had a flu shot in October and denies any other recent vaccines. He denies international travel. He denies red/swolen joints, mouth sores, leg swelling, new back pain, and bone pains.   Today, the pt notes that he is at his baseline and denies feeling any differently today as compared to 6 months ago.  Of note prior to the patient's visit today, pt has had [place] RUQ completed on 09/26/18 with results revealing Diffuse increased echotexture of the liver, nonspecific but  can be seen in fatty infiltration of liver.   Most recent lab results (09/25/18) of CMP is as follows: all values are WNL except for Glucose at 112, BUN/Creatinine ratio at 6, Calcium at 8.5, Albumin at 3.6, AST at 91, ALT at 174. 09/25/18 SPEP revealed all values WNL except for M spike at 0.3g 09/25/18 UPEP revealed all values WNL, including the absence of an M spike. 09/18/18 CBC w/diff revealed all values WNL except for Lymphs abs at 3.4k  On review of systems, pt reports good energy levels, eating well, stable weight, recent tooth pain, and denies fevers, chills, night sweats, present concerns of infections, red/swollen joints, mouth sores, abdominal pains, back pains, bone pains, leg swelling, testicular pain or swelling, problems passing urine, and any other symptoms.   On PMHx the pt denies medical history. 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 Reports social alcohol consumption. On Family Hx the pt denies blood disorders and cancers   MEDICAL HISTORY:  Past Medical History:  Diagnosis Date  . Abnormal SPEP 09/29/2018   M-spike    SURGICAL HISTORY: No past surgical history on file.  SOCIAL HISTORY: Social History   Socioeconomic History  . Marital status: Single    Spouse name: Not on file  . Number of children: Not on file  . Years of education: Not on file  . Highest education level: Not on file  Occupational History  . Not on file  Social  Needs  . Financial resource strain: Not on file  . Food insecurity:    Worry: Not on file    Inability: Not on file  . Transportation needs:    Medical: Not on file    Non-medical: Not on file  Tobacco Use  . Smoking status: Never Smoker  . Smokeless tobacco: Never Used  Substance and Sexual Activity  . Alcohol use: Yes    Comment: social  . Drug use: No  . Sexual activity: Yes    Partners: Male  Lifestyle  . Physical activity:    Days per week: Not on file    Minutes per session: Not on file   . Stress: Not on file  Relationships  . Social connections:    Talks on phone: Not on file    Gets together: Not on file    Attends religious service: Not on file    Active member of club or organization: Not on file    Attends meetings of clubs or organizations: Not on file    Relationship status: Not on file  . Intimate partner violence:    Fear of current or ex partner: Not on file    Emotionally abused: Not on file    Physically abused: Not on file    Forced sexual activity: Not on file  Other Topics Concern  . 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 Diabetes Mother   . Aneurysm Father        repaired   . Hypertension Maternal Grandmother   . Alzheimer's disease Paternal Grandmother     ALLERGIES:  has No Known Allergies.  MEDICATIONS:  Current Outpatient Medications  Medication Sig Dispense Refill  . emtricitabine-tenofovir (TRUVADA) 200-300 MG tablet TAKE ONE TABLET BY MOUTH ONCE DAILY WITH OR WITHOUT FOOD. STORE IN ORIGINAL CONTAINER AT ROOM TEMPERATURE. 90 tablet 0  . Multiple Vitamin (MULTIVITAMIN) tablet Take 1 tablet by mouth daily.    . Omega-3 Fatty Acids (FISH OIL) 1000 MG CAPS Take 1 capsule by mouth daily.     No current facility-administered medications for this visit.     REVIEW OF SYSTEMS:    10 Point review of Systems was done is negative except as noted above.  PHYSICAL EXAMINATION:  . Vitals:   10/06/18 1056  BP: 126/81  Pulse: 99  Resp: 18  Temp: 97.9 F (36.6 C)  SpO2: 99%   Filed Weights   10/06/18 1056  Weight: (!) 316 lb 4.8 oz (143.5 kg)   .Body mass index is 39.53 kg/m.  GENERAL:alert, in no acute distress and comfortable SKIN: no acute rashes, no significant lesions EYES: conjunctiva are pink and non-injected, sclera anicteric OROPHARYNX: MMM, no exudates, no oropharyngeal erythema or ulceration NECK: supple,  no JVD LYMPH:  no palpable lymphadenopathy in the cervical, axillary or inguinal regions LUNGS: clear to auscultation b/l with normal respiratory effort HEART: regular rate & rhythm ABDOMEN:  normoactive bowel sounds , non tender, not distended. No palpable hepatosplenomegaly. Extremity: no pedal edema PSYCH: alert & oriented x 3 with fluent speech NEURO: no focal motor/sensory deficits  LABORATORY DATA:  I have reviewed the data as listed  . CBC Latest Ref Rng & Units 09/18/2018 03/24/2018 04/04/2016  WBC 3.4 - 10.8 x10E3/uL 6.9 7.3 7.2  Hemoglobin 13.0 - 17.7 g/dL 13.7 16.2 14.7  Hematocrit 37.5 - 51.0 % 38.9 45.0 42.8  Platelets 150 - 450 x10E3/uL 277 300 309    . CMP Latest Ref Rng & Units 09/25/2018 09/18/2018 03/24/2018  Glucose 65 - 99 mg/dL 112(H) 103(H) 95  BUN 6 - 24 mg/dL _0 Creatinine 0.76 - 1.27 mg/dL 0.95 1.04 1.01  Sodium 134 - 144 mmol/L 141 137 137  Potassium 3.5 - 5.2 mmol/L 4.6 4.6 4.6  Chloride 96 - 106 mmol/L 102 97 99  CO2 20 - 29 mmol/L _1 Calcium 8.7 - 10.2 mg/dL 8.5(L) 9.2 9.6  Total Protein 6.0 - 8.5 g/dL 6.6 7.2 7.3  Total Bilirubin 0.0 - 1.2 mg/dL 0.4 0.6 0.5  Alkaline Phos 39 - 117 IU/L 98 104 89  AST 0 - 40 IU/L 91(H) 67(H) 13  ALT 0 - 44 IU/L 174(H) 95(H) 17   Component     Latest Ref Rng & Units 09/25/2018 10/06/2018  IgG (Immunoglobin G), Serum     700 - 1,600 mg/dL  1,454  IgA     90 - 386 mg/dL  346  IgM (Immunoglobulin M), Srm     20 - 172 mg/dL  147  Total Protein ELP     6.0 - 8.5 g/dL  6.9  Albumin SerPl Elph-Mcnc     2.9 - 4.4 g/dL  3.6  Alpha 1     0.0 - 0.4 g/dL 0.3 0.2  Alpha2 Glob SerPl Elph-Mcnc     0.4 - 1.0 g/dL  0.6  B-Globulin SerPl Elph-Mcnc     0.7 - 1.3 g/dL  1.1  Gamma Glob SerPl Elph-Mcnc     0.4 - 1.8 g/dL  1.4  M Protein SerPl Elph-Mcnc     Not Observed g/dL  Not Observed  Globulin, Total     2.2 - 3.9 g/dL 3.3 3.3  Albumin/Glob SerPl     0.7 - 1.7  1.1  IFE 1       Comment  Please Note (HCV):        Comment  Albumin ELP     2.9 - 4.4 g/dL 3.3   Alpha 2     0.4 - 1.0 g/dL 0.6   Beta     0.7 - 1.3 g/dL 1.1   Gamma Globulin     0.4 - 1.8 g/dL 1.4   M-SPIKE, %     Not Observed g/dL 0.3 (H)   A/G Ratio     0.7 - 1.7 1.0   Please Note:      Comment   Interpretation      Comment   Hepatitis B Surface Ag     Negative  Negative  HCV Ab     0.0 - 0.9 s/co ratio  <0.1  Hep A Ab, IgM     Negative  Negative  Hep B Core Ab, IgM     Negative  Negative  Retic Ct Pct     0.4 - 3.1 %  1.6  RBC.     4.22 - 5.81 MIL/uL  4.31  Retic Count, Absolute     19.0 - 186.0 K/uL  66.8  Immature Retic Fract     2.3 - 15.9 %  9.3  Kappa free light chain     3.3 - 19.4 mg/L  30.8 (H)  Lamda free light chains     5.7 - 26.3 mg/L  33.2 (H)  Kappa, lamda light chain ratio     0.26 - 1.65  0.93  CMV IgM     0.0 -  29.9 AU/mL  187.0 (H)  CMV Ab - IgG     0.00 - 0.59 U/mL  9.60 (H)  Beta-2 Microglobulin     0.6 - 2.4 mg/L  5.0 (H)  LDH     98 - 192 U/L  222 (H)    RADIOGRAPHIC STUDIES: I have personally reviewed the radiological images as listed and agreed with the findings in the report. US Abdomen Limited Ruq  Result Date: 09/26/2018 CLINICAL DATA:  Elevated liver function tests. EXAM: ULTRASOUND ABDOMEN LIMITED RIGHT UPPER QUADRANT COMPARISON:  None. FINDINGS: Gallbladder: No gallstones or wall thickening visualized. No sonographic Murphy sign noted by sonographer. Common bile duct: Diameter: 3.2 mm Liver: No focal lesion identified. Diffuse increased echotexture of the liver is identified. Portal vein is patent on color Doppler imaging with normal direction of blood flow towards the liver. IMPRESSION: Diffuse increased echotexture of the liver, nonspecific but can be seen in fatty infiltration of liver. Electronically Signed   By: Abelardo Diesel M.D.   On: 09/26/2018 14:52    ASSESSMENT & PLAN:  43 y.o. male with  1. Monoclonal Proteinemia  -Discussed patient's most recent labs, 09/18/18  CBC w/diff revealed WBC normal at 6.9k, HGB normal at 13.7, PLT normal at 277k, and mild lymphocytosis with Lymphs at 3.4k. 09/25/18 CMP revealed Calcium slightly low at 8.5, normal Creatinine at 0.95, elevatd AST at 91 and ALT at 174 -Discussed the 09/25/18 SPEP results which revealed M Protein at 0.3g, and the 09/25/18 UPEP did not reveal M Protein -Reviewed the 09/26/18 US Abdomen RUQ which revealed Diffuse increased echotexture of the liver, nonspecific but can be seen in fatty infiltration of liver. -Suspect M Protein due to inflammation vs reaction to infection vs primary bone marrow problem -Discussed that having a primary bone marrow disorder is quite unusual and unlikely in an individual at the patient's current age, and that his amount of M Protein of 0.3g is low, and he did not have M Protein in the urine. -rpt M protein not detected. -Pt had symptoms that most resemble a passing virus, possibly CMV?, which lasted 3 weeks between roughly 08/29/18 and 09/20/18 (CMV w/u was sent and CMV IgM noted to be positive suggesting recent infection) -Pt has been taking more NSAIDs recently, and advised that he limit his NSAID use and advised taking Tylenol instead -Based on the clinical context, I would recommend repeating blood tests today and again in 4 months -Do not recommend bone survey nor a bone marrow biopsy at this time -Establish care with GI Dr. Scarlette Shorts next week for management of elevated liver functions and abnormal liver US -Recommend PCP consider nephrology consultation if proteinuria persists -Recommended that the pt continue to eat well, drink at least 48-64 oz of water each day, walk 20-30 minutes each day, and maintain ideal body weight. -Will see the pt back in 4 months   Labs today RTC with dr Irene Limbo with labs in 4 months (plz schedule labs 1 week prior to that clinic appointment)   All of the patients questions were answered with apparent satisfaction. The patient knows to call the  clinic with any problems, questions or concerns.  The total time spent in the appt was 45 minutes and more than 50% was on counseling and direct patient cares.    Sullivan Lone MD [place] AAHIVMS [facility] Hematology/Oncology Physician [facility] [place]  (Office):       [phone] (Work cell):  [phone] (Fax):  [phone]  10/06/2018 11:33 AM  I, Baldwin [place], am acting as a scribe for Dr. Sullivan Lone.   .I have reviewed the above documentation for accuracy and completeness, and I agree with the above. Brunetta Genera MD

## 2018-10-06 ENCOUNTER — Other Ambulatory Visit: Payer: BC Managed Care – PPO

## 2018-10-06 ENCOUNTER — Inpatient Hospital Stay: Payer: BC Managed Care – PPO

## 2018-10-06 ENCOUNTER — Inpatient Hospital Stay: Payer: BC Managed Care – PPO | Attending: Hematology | Admitting: Hematology

## 2018-10-06 ENCOUNTER — Telehealth: Payer: Self-pay

## 2018-10-06 VITALS — BP 126/81 | HR 99 | Temp 97.9°F | Resp 18 | Ht 75.0 in | Wt 316.3 lb

## 2018-10-06 DIAGNOSIS — I10 Essential (primary) hypertension: Secondary | ICD-10-CM | POA: Diagnosis not present

## 2018-10-06 DIAGNOSIS — D472 Monoclonal gammopathy: Secondary | ICD-10-CM | POA: Diagnosis present

## 2018-10-06 DIAGNOSIS — R945 Abnormal results of liver function studies: Secondary | ICD-10-CM | POA: Diagnosis not present

## 2018-10-06 DIAGNOSIS — R7989 Other specified abnormal findings of blood chemistry: Secondary | ICD-10-CM

## 2018-10-06 DIAGNOSIS — R809 Proteinuria, unspecified: Secondary | ICD-10-CM

## 2018-10-06 DIAGNOSIS — B259 Cytomegaloviral disease, unspecified: Secondary | ICD-10-CM

## 2018-10-06 DIAGNOSIS — E119 Type 2 diabetes mellitus without complications: Secondary | ICD-10-CM | POA: Diagnosis not present

## 2018-10-06 LAB — CMP (CANCER CENTER ONLY)
ALK PHOS: 86 U/L (ref 38–126)
ALT: 61 U/L — ABNORMAL HIGH (ref 0–44)
AST: 35 U/L (ref 15–41)
Albumin: 3.5 g/dL (ref 3.5–5.0)
Anion gap: 7 (ref 5–15)
BUN: 7 mg/dL (ref 6–20)
CALCIUM: 8.9 mg/dL (ref 8.9–10.3)
CO2: 25 mmol/L (ref 22–32)
Chloride: 108 mmol/L (ref 98–111)
Creatinine: 0.98 mg/dL (ref 0.61–1.24)
GFR, Est AFR Am: >60 mL/min (ref >60)
GFR, Estimated: >60 mL/min (ref >60)
Glucose, Bld: 105 mg/dL — ABNORMAL HIGH (ref 70–99)
Potassium: 4.2 mmol/L (ref 3.5–5.1)
Sodium: 140 mmol/L (ref 135–145)
Total Bilirubin: 0.4 mg/dL (ref 0.3–1.2)
Total Protein: 7.3 g/dL (ref 6.5–8.1)

## 2018-10-06 LAB — CBC WITH DIFFERENTIAL/PLATELET
Abs Immature Granulocytes: 0.03 10*3/uL (ref 0.00–0.07)
Basophils Absolute: 0 10*3/uL (ref 0.0–0.1)
Basophils Relative: 1 %
Eosinophils Absolute: 0.1 10*3/uL (ref 0.0–0.5)
Eosinophils Relative: 2 %
HCT: 37.3 % — ABNORMAL LOW (ref 39.0–52.0)
Hemoglobin: 12.4 g/dL — ABNORMAL LOW (ref 13.0–17.0)
Immature Granulocytes: 1 %
Lymphocytes Relative: 50 %
Lymphs Abs: 2.7 10*3/uL (ref 0.7–4.0)
MCH: 28.8 pg (ref 26.0–34.0)
MCHC: 33.2 g/dL (ref 30.0–36.0)
MCV: 86.5 fL (ref 80.0–100.0)
MONO ABS: 0.4 10*3/uL (ref 0.1–1.0)
Monocytes Relative: 7 %
Neutro Abs: 2.1 10*3/uL (ref 1.7–7.7)
Neutrophils Relative %: 39 %
Platelets: 238 10*3/uL (ref 150–400)
RBC: 4.31 MIL/uL (ref 4.22–5.81)
RDW: 12.9 % (ref 11.5–15.5)
WBC: 5.5 10*3/uL (ref 4.0–10.5)
nRBC: 0 % (ref 0.0–0.2)

## 2018-10-06 LAB — RETICULOCYTES
Immature Retic Fract: 9.3 % (ref 2.3–15.9)
RBC.: 4.31 MIL/uL (ref 4.22–5.81)
Retic Count, Absolute: 66.8 10*3/uL (ref 19.0–186.0)
Retic Ct Pct: 1.6 % (ref 0.4–3.1)

## 2018-10-06 LAB — LACTATE DEHYDROGENASE: LDH: 222 U/L — ABNORMAL HIGH (ref 98–192)

## 2018-10-06 NOTE — Telephone Encounter (Signed)
Printed avs and calender of upcoming appointment. Per 2/17 los 

## 2018-10-07 LAB — CMV IGM: CMV IgM: 187 AU/mL — ABNORMAL HIGH (ref 0.0–29.9)

## 2018-10-07 LAB — HEPATITIS PANEL, ACUTE
HCV Ab: <0.1 s/co ratio (ref 0.0–0.9)
HEP A IGM: NEGATIVE
Hep B C IgM: NEGATIVE
Hepatitis B Surface Ag: NEGATIVE

## 2018-10-07 LAB — CMV ANTIBODY, IGG (EIA): CMV Ab - IgG: 9.6 U/mL — ABNORMAL HIGH (ref 0.00–0.59)

## 2018-10-07 LAB — KAPPA/LAMBDA LIGHT CHAINS
Kappa free light chain: 30.8 mg/L — ABNORMAL HIGH (ref 3.3–19.4)
Kappa, lambda light chain ratio: 0.93 (ref 0.26–1.65)
Lambda free light chains: 33.2 mg/L — ABNORMAL HIGH (ref 5.7–26.3)

## 2018-10-07 LAB — BETA 2 MICROGLOBULIN, SERUM: Beta-2 Microglobulin: 5 mg/L — ABNORMAL HIGH (ref 0.6–2.4)

## 2018-10-09 LAB — MULTIPLE MYELOMA PANEL, SERUM
Albumin SerPl Elph-Mcnc: 3.6 g/dL (ref 2.9–4.4)
Albumin/Glob SerPl: 1.1 (ref 0.7–1.7)
Alpha 1: 0.2 g/dL (ref 0.0–0.4)
Alpha2 Glob SerPl Elph-Mcnc: 0.6 g/dL (ref 0.4–1.0)
B-Globulin SerPl Elph-Mcnc: 1.1 g/dL (ref 0.7–1.3)
GAMMA GLOB SERPL ELPH-MCNC: 1.4 g/dL (ref 0.4–1.8)
Globulin, Total: 3.3 g/dL (ref 2.2–3.9)
IgA: 346 mg/dL (ref 90–386)
IgG (Immunoglobin G), Serum: 1454 mg/dL (ref 700–1600)
IgM (Immunoglobulin M), Srm: 147 mg/dL (ref 20–172)
Total Protein ELP: 6.9 g/dL (ref 6.0–8.5)

## 2018-10-15 ENCOUNTER — Ambulatory Visit: Payer: BC Managed Care – PPO | Admitting: Internal Medicine

## 2018-10-15 ENCOUNTER — Other Ambulatory Visit (INDEPENDENT_AMBULATORY_CARE_PROVIDER_SITE_OTHER): Payer: BC Managed Care – PPO

## 2018-10-15 ENCOUNTER — Encounter: Payer: Self-pay | Admitting: Internal Medicine

## 2018-10-15 VITALS — BP 118/82 | HR 68 | Ht 75.0 in | Wt 312.8 lb

## 2018-10-15 DIAGNOSIS — R932 Abnormal findings on diagnostic imaging of liver and biliary tract: Secondary | ICD-10-CM

## 2018-10-15 DIAGNOSIS — R945 Abnormal results of liver function studies: Secondary | ICD-10-CM

## 2018-10-15 DIAGNOSIS — R7989 Other specified abnormal findings of blood chemistry: Secondary | ICD-10-CM

## 2018-10-15 LAB — HEPATIC FUNCTION PANEL
ALT: 35 U/L (ref 0–53)
AST: 23 U/L (ref 0–37)
Albumin: 4.1 g/dL (ref 3.5–5.2)
Alkaline Phosphatase: 87 U/L (ref 39–117)
Bilirubin, Direct: 0.2 mg/dL (ref 0.0–0.3)
Total Bilirubin: 0.7 mg/dL (ref 0.2–1.2)
Total Protein: 7.6 g/dL (ref 6.0–8.3)

## 2018-10-15 NOTE — Patient Instructions (Signed)
Your provider has requested that you go to the basement level for lab work before leaving today. Press "B" on the elevator. The lab is located at the first door on the left as you exit the elevator.  

## 2018-10-15 NOTE — Progress Notes (Signed)
HISTORY OF PRESENT ILLNESS:  Andrew Harrington is a pleasant 43 y.o. 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  Patient is overall quite healthy.  Recently evaluated by hematology for monoclonal proteinemia.  He does take prophylactic Truvada daily for several years.  The patient developed a significant flulike illness last month.  He underwent evaluation in the form of blood work.  On September 18, 2018 his CBC was normal with slight lymphocytosis.  Comprehensive metabolic panel was remarkable for mildly elevated transaminases with AST 67 and ALT 95.  These were repeated 1 week later with AST 91 and ALT 174.  Acute hepatitis serologies were negative.  A right upper quadrant ultrasound was ordered and revealed nonspecific increased echotexture of the liver.  Review of laboratories from his more recent office evaluation October 06, 2018 revealed normal liver test except for ALT of 61.  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  Patient is recovering nicely from his acute illness without active symptoms at present.  He denies a personal or family history of liver disease.  Rarely uses alcohol.  No history of hepatitis.  REVIEW OF SYSTEMS:  All non-GI ROS negative unless otherwise stated in the HPI   Past Medical History:  Diagnosis Date  . Abnormal SPEP 09/29/2018   M-spike    No past surgical history on file.  Social History Andrew Harrington  reports that he has never smoked. He has never used smokeless tobacco. He reports current alcohol use. He reports that he does not use drugs.  family history includes Alzheimer's disease in his paternal grandmother; Aneurysm in his father; Diabetes in his mother; Hypertension in his maternal grandmother.  No Known Allergies     PHYSICAL EXAMINATION: Vital signs: BP 118/82   Pulse 68   Ht 6\' 3"  (1.905 m)   Wt (!) 312 lb 12.8  oz (141.9 kg)   BMI 39.10 kg/m   Constitutional: generally well-appearing, no acute distress Psychiatric: alert and oriented x3, cooperative Eyes: extraocular movements intact, anicteric, conjunctiva pink Mouth: oral pharynx moist, no lesions Neck: supple no lymphadenopathy Cardiovascular: heart regular rate and rhythm, no murmur Lungs: clear to auscultation bilaterally Abdomen: soft, nontender, nondistended, no obvious ascites, no peritoneal signs, normal bowel sounds, no organomegaly Rectal: Omitted Extremities: no clubbing, cyanosis, or lower extremity edema bilaterally Skin: no lesions on visible extremities Neuro: No focal deficits. No asterixis.  Cranial nerves intact  ASSESSMENT:  1.  Recently elevated hepatic transaminases which are now trending toward normal.  Almost certainly secondary to his acute viral infection (likely CMV) which has resolved. 2.  Increased hepatic echotexture likely secondary to fatty liver at a gentleman with BMI 39   PLAN:  1.  Follow-up liver test today.  If these are normal, no further work-up needed.  If still abnormal, continue to trend.  If persistently abnormal for 6 months would pursue additional work-up for chronic liver test abnormalities 2.  Exercise and weight loss 3.  Ongoing general medical care with nurse Raenette Rover

## 2018-10-16 ENCOUNTER — Telehealth: Payer: Self-pay | Admitting: *Deleted

## 2018-10-16 NOTE — Telephone Encounter (Signed)
Contacted patient per Dr. Grier Mitts direction with results of recent tests: Please let patient know that his lab suggests he had a recent CMV infection. Continue planned f/u with PCP and GI to monitor resolution of LFTs and for further management of CMV if needed.  Left this information on named voice mail with instructions to call office number [phone] for further questions or concerns.

## 2018-12-30 ENCOUNTER — Telehealth: Payer: Self-pay | Admitting: Family Medicine

## 2018-12-30 DIAGNOSIS — R801 Persistent proteinuria, unspecified: Secondary | ICD-10-CM

## 2018-12-30 DIAGNOSIS — Z7251 High risk heterosexual behavior: Secondary | ICD-10-CM

## 2018-12-30 NOTE — Telephone Encounter (Signed)
Needs labs (HIV, RPR) and a repeat urinalysis dipstick due to protein in his urine last time. He will need to wait in office until I can review urine result. He can come in for an office visit if he prefers.

## 2018-12-30 NOTE — Telephone Encounter (Signed)
  Patient called, wants to schedule lab appointment, states he needs labs for his [place]   No orders in system, ? Ok to schedule lab only or does he need visit

## 2018-12-31 NOTE — Telephone Encounter (Signed)
Left message for pt to call me back 

## 2018-12-31 NOTE — Telephone Encounter (Signed)
I put future labs in, incase pt calls back to schedule

## 2019-01-02 ENCOUNTER — Other Ambulatory Visit (INDEPENDENT_AMBULATORY_CARE_PROVIDER_SITE_OTHER): Payer: BC Managed Care – PPO

## 2019-01-02 ENCOUNTER — Other Ambulatory Visit: Payer: Self-pay

## 2019-01-02 DIAGNOSIS — Z7251 High risk heterosexual behavior: Secondary | ICD-10-CM

## 2019-01-02 DIAGNOSIS — R801 Persistent proteinuria, unspecified: Secondary | ICD-10-CM

## 2019-01-02 LAB — POCT URINALYSIS DIP (PROADVANTAGE DEVICE)
Bilirubin, UA: NEGATIVE
Blood, UA: NEGATIVE
Glucose, UA: NEGATIVE mg/dL
Ketones, POC UA: NEGATIVE mg/dL
Leukocytes, UA: NEGATIVE
Nitrite, UA: NEGATIVE
Protein Ur, POC: NEGATIVE mg/dL
Specific Gravity, Urine: 1.01
Urobilinogen, Ur: NEGATIVE
pH, UA: 6.5 (ref 5.0–8.0)

## 2019-01-03 ENCOUNTER — Other Ambulatory Visit: Payer: Self-pay | Admitting: Family Medicine

## 2019-01-03 LAB — RPR: RPR Ser Ql: NONREACTIVE

## 2019-01-03 LAB — HIV ANTIBODY (ROUTINE TESTING W REFLEX): HIV Screen 4th Generation wRfx: NONREACTIVE

## 2019-01-03 MED ORDER — EMTRICITABINE-TENOFOVIR DF 200-300 MG PO TABS: ORAL_TABLET | ORAL | 0 refills | Status: DC

## 2019-02-02 ENCOUNTER — Other Ambulatory Visit: Payer: BC Managed Care – PPO

## 2019-02-09 ENCOUNTER — Ambulatory Visit: Payer: BC Managed Care – PPO | Admitting: Hematology

## 2019-03-30 ENCOUNTER — Ambulatory Visit: Payer: BC Managed Care – PPO | Admitting: Family Medicine

## 2019-03-30 ENCOUNTER — Other Ambulatory Visit: Payer: Self-pay

## 2019-03-30 ENCOUNTER — Encounter: Payer: Self-pay | Admitting: Family Medicine

## 2019-03-30 VITALS — BP 122/80 | HR 96 | Temp 98.3°F | Ht 75.0 in | Wt 327.8 lb

## 2019-03-30 DIAGNOSIS — R7303 Prediabetes: Secondary | ICD-10-CM

## 2019-03-30 DIAGNOSIS — Z833 Family history of diabetes mellitus: Secondary | ICD-10-CM

## 2019-03-30 DIAGNOSIS — Z79899 Other long term (current) drug therapy: Secondary | ICD-10-CM

## 2019-03-30 DIAGNOSIS — Z7251 High risk heterosexual behavior: Secondary | ICD-10-CM

## 2019-03-30 DIAGNOSIS — Z0000 Encounter for general adult medical examination without abnormal findings: Secondary | ICD-10-CM

## 2019-03-30 DIAGNOSIS — R801 Persistent proteinuria, unspecified: Secondary | ICD-10-CM

## 2019-03-30 DIAGNOSIS — E782 Mixed hyperlipidemia: Secondary | ICD-10-CM

## 2019-03-30 DIAGNOSIS — Z6841 Body Mass Index (BMI) 40.0 and over, adult: Secondary | ICD-10-CM

## 2019-03-30 LAB — POCT URINALYSIS DIP (PROADVANTAGE DEVICE)
Bilirubin, UA: NEGATIVE
Blood, UA: NEGATIVE
Glucose, UA: NEGATIVE mg/dL
Leukocytes, UA: NEGATIVE
Nitrite, UA: NEGATIVE
Specific Gravity, Urine: 1.025
Urobilinogen, Ur: NEGATIVE
pH, UA: 6 (ref 5.0–8.0)

## 2019-03-30 NOTE — Progress Notes (Signed)
Subjective:    Patient ID: Andrew Harrington, male    DOB: 1975-11-06, 43 y.o.   MRN: [id number]  HPI Chief Complaint  Patient presents with  . Annual Exam   He is here for a complete physical exam and to follow up on chronic health conditions.   Reports feeling well today. No recent illness or new concerns.  Taking Truvada for PrEP daily. Due for recheck HIV today. Will need his 3 month refill.   Other providers: Dr. Henrene Pastor - GI  Dr. Irene Limbo- oncology Dermatologist- [facility]   He saw Dr. Irene Limbo with oncology due to abnormal SPEP. 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 States he was told "everything checked out ok". Did not follow up as recommended for repeat labs.   Saw GI for elevated LFTs and fatty liver.    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  Reviewed allergies, medications, past medical, surgical, family, and social history.     Review of Systems Review of Systems Constitutional: -fever, -chills, -sweats, -unexpected weight change,-fatigue ENT: -runny nose, -ear pain, -sore throat Cardiology:  -chest pain, -palpitations, -edema Respiratory: -cough, -shortness of breath, -wheezing Gastroenterology: -abdominal pain, -nausea, -vomiting, -diarrhea, -constipation  Hematology: -bleeding or bruising problems Musculoskeletal: -arthralgias, -myalgias, -joint swelling, -back pain Ophthalmology: -vision changes Urology: -dysuria, -difficulty urinating, -hematuria, -urinary frequency, -urgency Neurology: -headache, -weakness, -tingling, -numbness       Objective:   Physical Exam BP 122/80   Pulse 96   Temp 98.3 F (36.8 C) (Oral)   Ht 6\' 3"  (1.905 m)   Wt (!) 327 lb 12.8 oz (148.7 kg)   SpO2 98%   BMI 40.97 kg/m   General Appearance:    Alert, cooperative, no distress, appears stated age  Head:    Normocephalic, without obvious abnormality, atraumatic  Eyes:    PERRL, conjunctiva/corneas clear, EOM's intact, fundi    benign  Ears:    Normal TM's and external ear canals  Nose:   Mask in place  Throat:   Mask in place   Neck:   Supple, no lymphadenopathy;  thyroid:  no   enlargement/tenderness/nodules; no carotid   bruit or JVD  Back:    Spine nontender, no curvature, ROM normal, no CVA     tenderness  Lungs:     Clear to auscultation bilaterally without wheezes, rales or     ronchi; respirations unlabored  Chest Wall:    No tenderness or deformity   Heart:    Regular rate and rhythm, S1 and S2 normal, no murmur, rub   or gallop  Breast Exam:    No chest wall tenderness, masses or gynecomastia  Abdomen:     Soft, non-tender, nondistended, normoactive bowel sounds,    no masses, no hepatosplenomegaly  Genitalia:    Declines      Extremities:   No clubbing, cyanosis or edema  Pulses:   2+ and symmetric all extremities  Skin:   Skin color, texture, turgor normal, no rashes or lesions. Several nevi   Lymph nodes:   Cervical, supraclavicular, and axillary nodes normal  Neurologic:  CNII-XII intact, normal strength, sensation and gait; reflexes 2+ and symmetric throughout          Psych:   Normal mood, affect, hygiene and grooming.           Assessment & Plan:  Routine general medical examination at a health care facility - Plan: CBC with Differential/Platelet, Comprehensive metabolic panel, TSH, Lipid panel, POCT Urinalysis DIP (Proadvantage Device), in his usual state of health and no concerns. Seems to be happy in relationship and concerned about what the school year ahead will look like due to pandemic. Discussed safety and preventive  health.   Prediabetes - Plan: Hemoglobin A1c, at risk for developing diabetes due to obesity and family history. Follow up pending A1c. Counseling on diet and exercise   Family history of diabetes mellitus (DM) - Plan: Hemoglobin A1c, at risk for developing diabetes due to obesity and family history. Follow up pending A1c. Counseling on diet and exercise   High risk sexual behavior, unspecified type - Plan: HIV Antibody (routine testing w rflx), GC/Chlamydia Probe Amp, in a stable monogamous relationship. Check STDs and refill Truvada. He is doing well on PrEP and would like to continue   Mixed hyperlipidemia - Plan: Lipid panel, previous 10 yr ASCVD risk 1.8%, continue on fish oil and follow up pending lipid panel.   Persistent proteinuria - Plan: trace of protein on UA today. Recent prior UA negative for protein. Will continue to monitor closely and refer to nephrology as appropriate   Class 3 severe obesity with body mass index (BMI) of 40.0 to 44.9 in adult, unspecified obesity type, unspecified whether serious comorbidity present ([place]) - Plan: counseling on healthy diet and exercise   Medication management - Plan: HIV Antibody (routine testing w rflx), refill Truvada as appropriate

## 2019-03-30 NOTE — Patient Instructions (Signed)
Preventive Care 40-41 Years Old, Male Preventive care refers to lifestyle choices and visits with your health care provider that can promote health and wellness. This includes:  A yearly physical exam. This is also called an annual well check.  Regular dental and eye exams.  Immunizations.  Screening for certain conditions.  Healthy lifestyle choices, such as eating a healthy diet, getting regular exercise, not using drugs or products that contain nicotine and tobacco, and limiting alcohol use. What can I expect for my preventive care visit? Physical exam Your health care provider will check:  Height and weight. These may be used to calculate body mass index (BMI), which is a measurement that tells if you are at a healthy weight.  Heart rate and blood pressure.  Your skin for abnormal spots. Counseling Your health care provider may ask you questions about:  Alcohol, tobacco, and drug use.  Emotional well-being.  Home and relationship well-being.  Sexual activity.  Eating habits.  Work and work environment. What immunizations do I need?  Influenza (flu) vaccine  This is recommended every year. Tetanus, diphtheria, and pertussis (Tdap) vaccine  You may need a Td booster every 10 years. Varicella (chickenpox) vaccine  You may need this vaccine if you have not already been vaccinated. Zoster (shingles) vaccine  You may need this after age 60. Measles, mumps, and rubella (MMR) vaccine  You may need at least one dose of MMR if you were born in 1957 or later. You may also need a second dose. Pneumococcal conjugate (PCV13) vaccine  You may need this if you have certain conditions and were not previously vaccinated. Pneumococcal polysaccharide (PPSV23) vaccine  You may need one or two doses if you smoke cigarettes or if you have certain conditions. Meningococcal conjugate (MenACWY) vaccine  You may need this if you have certain conditions. Hepatitis A vaccine   You may need this if you have certain conditions or if you travel or work in places where you may be exposed to hepatitis A. Hepatitis B vaccine  You may need this if you have certain conditions or if you travel or work in places where you may be exposed to hepatitis B. Haemophilus influenzae type b (Hib) vaccine  You may need this if you have certain risk factors. Human papillomavirus (HPV) vaccine  If recommended by your health care provider, you may need three doses over 6 months. You may receive vaccines as individual doses or as more than one vaccine together in one shot (combination vaccines). Talk with your health care provider about the risks and benefits of combination vaccines. What tests do I need? Blood tests  Lipid and cholesterol levels. These may be checked every 5 years, or more frequently if you are over 50 years old.  Hepatitis C test.  Hepatitis B test. Screening  Lung cancer screening. You may have this screening every year starting at age 55 if you have a 30-pack-year history of smoking and currently smoke or have quit within the past 15 years.  Prostate cancer screening. Recommendations will vary depending on your family history and other risks.  Colorectal cancer screening. All adults should have this screening starting at age 50 and continuing until age 75. Your health care provider may recommend screening at age 45 if you are at increased risk. You will have tests every 1-10 years, depending on your results and the type of screening test.  Diabetes screening. This is done by checking your blood sugar (glucose) after you have not eaten   for a while (fasting). You may have this done every 1-3 years.  Sexually transmitted disease (STD) testing. Follow these instructions at home: Eating and drinking  Eat a diet that includes fresh fruits and vegetables, whole grains, lean protein, and low-fat dairy products.  Take vitamin and mineral supplements as recommended  by your health care provider.  Do not drink alcohol if your health care provider tells you not to drink.  If you drink alcohol: ? Limit how much you have to 0-2 drinks a day. ? Be aware of how much alcohol is in your drink. In the [place] one drink equals one 12 oz bottle of beer (355 mL), one 5 oz glass of wine (148 mL), or one 1 oz glass of hard liquor (44 mL). Lifestyle  Take daily care of your teeth and gums.  Stay active. Exercise for at least 30 minutes on 5 or more days each week.  Do not use any products that contain nicotine or tobacco, such as cigarettes, e-cigarettes, and chewing tobacco. If you need help quitting, ask your health care provider.  If you are sexually active, practice safe sex. Use a condom or other form of protection to prevent STIs (sexually transmitted infections).  Talk with your health care provider about taking a low-dose aspirin every day starting at age 33. What's next?  Go to your health care provider once a year for a well check visit.  Ask your health care provider how often you should have your eyes and teeth checked.  Stay up to date on all vaccines. This information is not intended to replace advice given to you by your health care provider. Make sure you discuss any questions you have with your health care provider. Document Released: 09/02/2015 Document Revised: 07/31/2018 Document Reviewed: 07/31/2018 Elsevier Patient Education  2020 [employer].

## 2019-03-31 LAB — LIPID PANEL
Chol/HDL Ratio: 5.6 ratio — ABNORMAL HIGH (ref 0.0–5.0)
Cholesterol, Total: 225 mg/dL — ABNORMAL HIGH (ref 100–199)
HDL: 40 mg/dL (ref >39)
LDL Calculated: 113 mg/dL — ABNORMAL HIGH (ref 0–99)
Triglycerides: 358 mg/dL — ABNORMAL HIGH (ref 0–149)
VLDL Cholesterol Cal: 72 mg/dL — ABNORMAL HIGH (ref 5–40)

## 2019-03-31 LAB — COMPREHENSIVE METABOLIC PANEL
ALT: 19 IU/L (ref 0–44)
AST: 13 IU/L (ref 0–40)
Albumin/Globulin Ratio: 1.7 (ref 1.2–2.2)
Albumin: 4.8 g/dL (ref 4.0–5.0)
Alkaline Phosphatase: 84 IU/L (ref 39–117)
BUN/Creatinine Ratio: 12 (ref 9–20)
BUN: 13 mg/dL (ref 6–24)
Bilirubin Total: 0.3 mg/dL (ref 0.0–1.2)
CO2: 23 mmol/L (ref 20–29)
Calcium: 9.6 mg/dL (ref 8.7–10.2)
Chloride: 100 mmol/L (ref 96–106)
Creatinine, Ser: 1.09 mg/dL (ref 0.76–1.27)
GFR calc Af Amer: 97 mL/min/{1.73_m2} (ref >59)
GFR calc non Af Amer: 84 mL/min/{1.73_m2} (ref >59)
Globulin, Total: 2.8 g/dL (ref 1.5–4.5)
Glucose: 105 mg/dL — ABNORMAL HIGH (ref 65–99)
Potassium: 4.4 mmol/L (ref 3.5–5.2)
Sodium: 137 mmol/L (ref 134–144)
Total Protein: 7.6 g/dL (ref 6.0–8.5)

## 2019-03-31 LAB — CBC WITH DIFFERENTIAL/PLATELET
Basophils Absolute: 0 10*3/uL (ref 0.0–0.2)
Basos: 1 %
EOS (ABSOLUTE): 0.1 10*3/uL (ref 0.0–0.4)
Eos: 2 %
Hematocrit: 45.8 % (ref 37.5–51.0)
Hemoglobin: 15.2 g/dL (ref 13.0–17.7)
Immature Grans (Abs): 0 10*3/uL (ref 0.0–0.1)
Immature Granulocytes: 0 %
Lymphocytes Absolute: 1.9 10*3/uL (ref 0.7–3.1)
Lymphs: 31 %
MCH: 30.9 pg (ref 26.6–33.0)
MCHC: 33.2 g/dL (ref 31.5–35.7)
MCV: 93 fL (ref 79–97)
Monocytes Absolute: 0.4 10*3/uL (ref 0.1–0.9)
Monocytes: 6 %
Neutrophils Absolute: 3.8 10*3/uL (ref 1.4–7.0)
Neutrophils: 60 %
Platelets: 297 10*3/uL (ref 150–450)
RBC: 4.92 x10E6/uL (ref 4.14–5.80)
RDW: 12.8 % (ref 11.6–15.4)
WBC: 6.3 10*3/uL (ref 3.4–10.8)

## 2019-03-31 LAB — HIV ANTIBODY (ROUTINE TESTING W REFLEX): HIV Screen 4th Generation wRfx: NONREACTIVE

## 2019-03-31 LAB — HEMOGLOBIN A1C
Est. average glucose Bld gHb Est-mCnc: 108 mg/dL
Hgb A1c MFr Bld: 5.4 % (ref 4.8–5.6)

## 2019-03-31 LAB — TSH: TSH: 1.72 u[IU]/mL (ref 0.450–4.500)

## 2019-04-02 ENCOUNTER — Encounter: Payer: Self-pay | Admitting: Family Medicine

## 2019-04-02 ENCOUNTER — Other Ambulatory Visit: Payer: Self-pay | Admitting: Family Medicine

## 2019-04-02 ENCOUNTER — Telehealth: Payer: Self-pay

## 2019-04-02 MED ORDER — TRUVADA 200-300 MG PO TABS: ORAL_TABLET | ORAL | 0 refills | Status: DC

## 2019-04-02 NOTE — Telephone Encounter (Signed)
truvada sent to [facility]

## 2019-04-03 LAB — GC/CHLAMYDIA PROBE AMP
Chlamydia trachomatis, NAA: NEGATIVE
Neisseria Gonorrhoeae by PCR: NEGATIVE

## 2019-06-23 ENCOUNTER — Other Ambulatory Visit: Payer: BC Managed Care – PPO

## 2019-06-23 ENCOUNTER — Other Ambulatory Visit: Payer: Self-pay

## 2019-06-23 DIAGNOSIS — Z7251 High risk heterosexual behavior: Secondary | ICD-10-CM

## 2019-06-24 ENCOUNTER — Encounter: Payer: Self-pay | Admitting: Family Medicine

## 2019-06-24 ENCOUNTER — Other Ambulatory Visit: Payer: Self-pay | Admitting: Internal Medicine

## 2019-06-24 LAB — HIV ANTIBODY (ROUTINE TESTING W REFLEX): HIV Screen 4th Generation wRfx: NONREACTIVE

## 2019-06-24 LAB — RPR: RPR Ser Ql: NONREACTIVE

## 2019-06-24 MED ORDER — TRUVADA 200-300 MG PO TABS: ORAL_TABLET | ORAL | 0 refills | Status: DC

## 2019-07-20 ENCOUNTER — Other Ambulatory Visit: Payer: Self-pay | Admitting: Family Medicine

## 2019-07-20 NOTE — Telephone Encounter (Signed)
Pt has labs 11/3

## 2019-10-05 ENCOUNTER — Encounter: Payer: Self-pay | Admitting: Family Medicine

## 2019-10-05 ENCOUNTER — Ambulatory Visit: Payer: BC Managed Care – PPO | Admitting: Family Medicine

## 2019-10-05 ENCOUNTER — Other Ambulatory Visit: Payer: Self-pay

## 2019-10-05 VITALS — BP 110/82 | HR 77 | Temp 98.6°F | Wt 352.2 lb

## 2019-10-05 DIAGNOSIS — E782 Mixed hyperlipidemia: Secondary | ICD-10-CM

## 2019-10-05 DIAGNOSIS — Z7251 High risk heterosexual behavior: Secondary | ICD-10-CM

## 2019-10-05 DIAGNOSIS — E781 Pure hyperglyceridemia: Secondary | ICD-10-CM

## 2019-10-05 DIAGNOSIS — Z79899 Other long term (current) drug therapy: Secondary | ICD-10-CM | POA: Diagnosis not present

## 2019-10-05 DIAGNOSIS — R809 Proteinuria, unspecified: Secondary | ICD-10-CM

## 2019-10-05 NOTE — Progress Notes (Signed)
   Subjective:    Patient ID: Andrew Harrington, male    DOB: 1976/03/23, 44 y.o.   MRN: [id number]  HPI Chief Complaint  Patient presents with  . med check    med check   Here for a medication management visit. States he did not realize he needed to have fasting lipids checked so he is not fasting.  We are following up on elevated triglycerides and LDL.  States he has been taking fish oil for the past 6 months.   States he ate a chicken biscuit at 9:30 am.    Also due to have 3 month HIV, RPR for PrEP continuation. States he is taking Truvada daily without any issues. In a long term relationship.  Denies fever, chills, abdominal pain, N/V/D, urinary symptoms.   We are also checking up on proteinuria.      Review of Systems Pertinent positives and negatives in the history of present illness.     Objective:   Physical Exam BP 110/82   Pulse 77   Temp 98.6 F (37 C)   Wt (!) 352 lb 3.2 oz (159.8 kg)   BMI 44.02 kg/m   Alert and oriented and in no acute distress. Not otherwise examined.       Assessment & Plan:  Medication management - Plan: Lipid panel, HIV antibody (with reflex), RPR  Hypertriglyceridemia -he is not fasting. Continue on fish oil. Counseling on low fat.  Follow up for fasting labs in 2 days.   Mixed hyperlipidemia - Plan: Lipid panel -follow up for fasting labs in 2 days. Counseling on low fat, low cholesterol diet.   High risk sexual behavior, unspecified type - Plan: GC/Chlamydia Probe Amp, HIV antibody (with reflex), RPR -doing well on Truvada. Refill after testing negative for HIV, RPR, GC/CT  Proteinuria, unspecified type - Plan: POCT Urinalysis DIP (Proadvantage Device) -follow up pending UA

## 2019-10-07 ENCOUNTER — Other Ambulatory Visit (INDEPENDENT_AMBULATORY_CARE_PROVIDER_SITE_OTHER): Payer: BC Managed Care – PPO

## 2019-10-07 ENCOUNTER — Other Ambulatory Visit: Payer: Self-pay

## 2019-10-07 DIAGNOSIS — R809 Proteinuria, unspecified: Secondary | ICD-10-CM

## 2019-10-07 DIAGNOSIS — Z7251 High risk heterosexual behavior: Secondary | ICD-10-CM

## 2019-10-07 DIAGNOSIS — E782 Mixed hyperlipidemia: Secondary | ICD-10-CM

## 2019-10-07 DIAGNOSIS — Z79899 Other long term (current) drug therapy: Secondary | ICD-10-CM

## 2019-10-07 LAB — POCT URINALYSIS DIP (PROADVANTAGE DEVICE)
Bilirubin, UA: NEGATIVE
Blood, UA: NEGATIVE
Glucose, UA: NEGATIVE mg/dL
Ketones, POC UA: NEGATIVE mg/dL
Leukocytes, UA: NEGATIVE
Nitrite, UA: NEGATIVE
Protein Ur, POC: NEGATIVE mg/dL
Specific Gravity, Urine: 1.025
Urobilinogen, Ur: NEGATIVE
pH, UA: 6 (ref 5.0–8.0)

## 2019-10-08 ENCOUNTER — Other Ambulatory Visit: Payer: Self-pay | Admitting: Internal Medicine

## 2019-10-08 ENCOUNTER — Encounter: Payer: Self-pay | Admitting: Family Medicine

## 2019-10-08 ENCOUNTER — Other Ambulatory Visit: Payer: Self-pay | Admitting: Family Medicine

## 2019-10-08 LAB — LIPID PANEL
Chol/HDL Ratio: 6.1 ratio — ABNORMAL HIGH (ref 0.0–5.0)
Cholesterol, Total: 206 mg/dL — ABNORMAL HIGH (ref 100–199)
HDL: 34 mg/dL — ABNORMAL LOW (ref >39)
LDL Chol Calc (NIH): 121 mg/dL — ABNORMAL HIGH (ref 0–99)
Triglycerides: 287 mg/dL — ABNORMAL HIGH (ref 0–149)
VLDL Cholesterol Cal: 51 mg/dL — ABNORMAL HIGH (ref 5–40)

## 2019-10-08 LAB — RPR: RPR Ser Ql: NONREACTIVE

## 2019-10-08 LAB — HIV ANTIBODY (ROUTINE TESTING W REFLEX): HIV Screen 4th Generation wRfx: NONREACTIVE

## 2019-10-08 MED ORDER — EMTRICITABINE-TENOFOVIR DF 200-300 MG PO TABS: 1.0000 | ORAL_TABLET | Freq: Every day | ORAL | 0 refills | Status: DC

## 2019-10-08 MED ORDER — ICOSAPENT ETHYL 1 G PO CAPS: 2.0000 g | ORAL_CAPSULE | Freq: Two times a day (BID) | ORAL | 2 refills | Status: DC

## 2019-10-08 NOTE — Progress Notes (Signed)
Please send in Vascepa for him for hypertriglyceridemia. Omega 3 fatty acids are not working. We will most likely need a prior authorization.

## 2019-10-09 LAB — GC/CHLAMYDIA PROBE AMP
Chlamydia trachomatis, NAA: NEGATIVE
Neisseria Gonorrhoeae by PCR: NEGATIVE

## 2019-10-13 ENCOUNTER — Telehealth: Payer: Self-pay

## 2019-10-13 DIAGNOSIS — E782 Mixed hyperlipidemia: Secondary | ICD-10-CM

## 2019-10-13 NOTE — Telephone Encounter (Signed)
Please let him know that in order to have his insurance pay for the Vascepa prescription for his triglycerides, he needs to be on a low dose statin. I can send one in to his pharmacy if he is willing to start taking it. If he has any trouble with this, we can talk about how often he needs to take it (how many days per week). Let me know if he is ok with this plan.

## 2019-10-13 NOTE — Telephone Encounter (Signed)
I submitted a PA for the pts. Vascepa and it was denied by the insurance. They stated that the pt. Had to be being treated with a statin to go along with the Vescepa and he is not on a statin.

## 2019-10-14 NOTE — Telephone Encounter (Signed)
Left message for pt to call me back 

## 2019-10-15 NOTE — Telephone Encounter (Signed)
Left message for pt to call me back 

## 2019-10-16 MED ORDER — ATORVASTATIN CALCIUM 10 MG PO TABS: 10.0000 mg | ORAL_TABLET | Freq: Every day | ORAL | 0 refills | Status: DC

## 2019-10-16 NOTE — Telephone Encounter (Signed)
Let's start him on atorvastatin 10 mg and have him follow up for fasting lipids 6 weeks after starting it. Then, we will see if he still needs the Vascepa. Continue on omega 3 fatty acid for now.

## 2019-10-16 NOTE — Telephone Encounter (Signed)
Pt was notified and will call back to schedule a fasting lipids recheck

## 2019-10-16 NOTE — Telephone Encounter (Signed)
Pt states he is ok with taking a statin. Would the statin include starting on vasecpa too?

## 2019-11-25 IMAGING — US US ABDOMEN LIMITED
1 series · 14 of 25 positions shown · non-contrast
Comparison: None.

CLINICAL DATA: Elevated liver function tests.

EXAM:
ULTRASOUND ABDOMEN LIMITED RIGHT UPPER QUADRANT

[Series 1: us abdomen limited · 0.26mm/px · 14 of 35 slices shown]
[im 1/35]
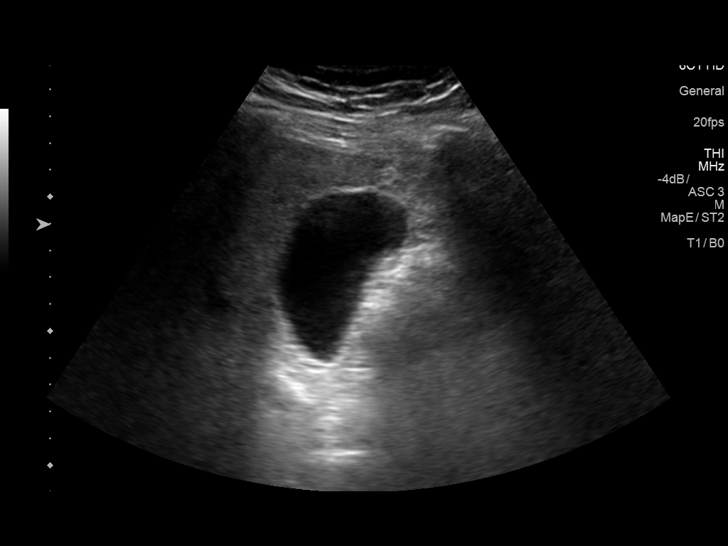
[im 3/35]
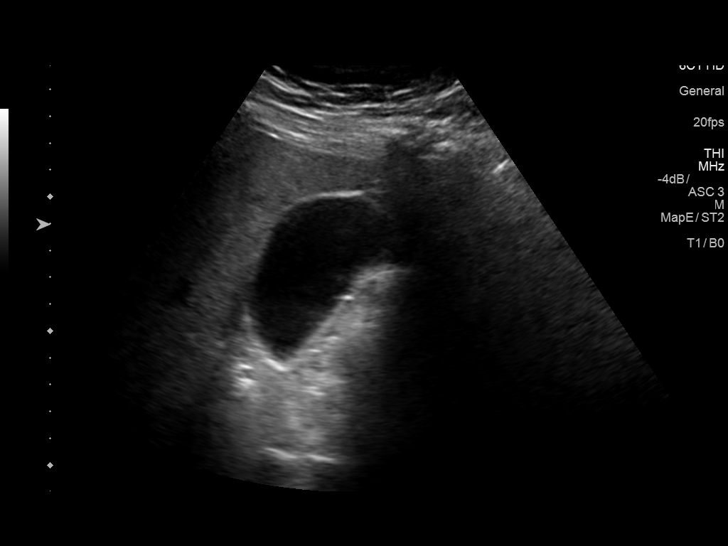
[im 6/35]
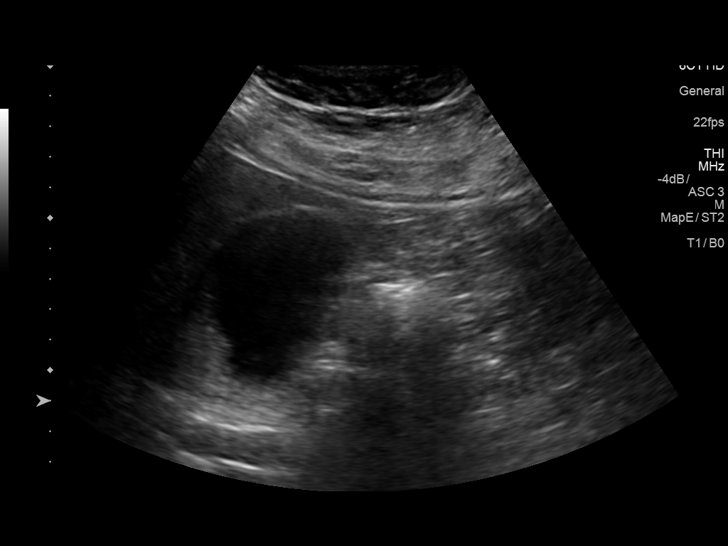
[im 9/35]
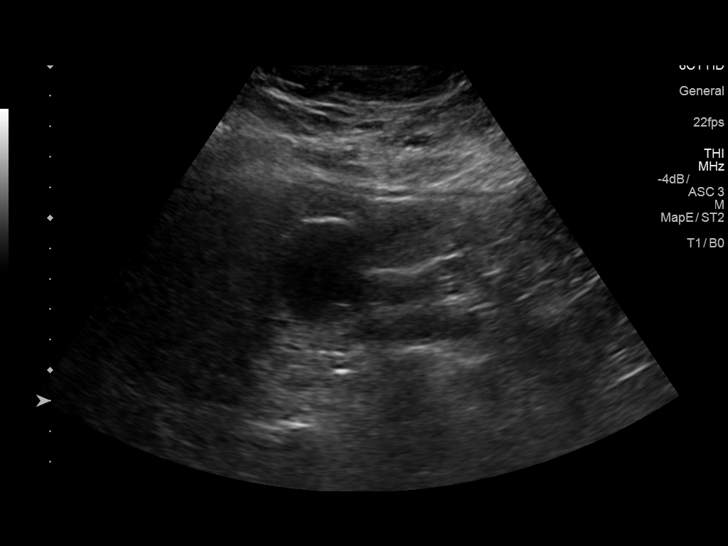
[im 12/35]
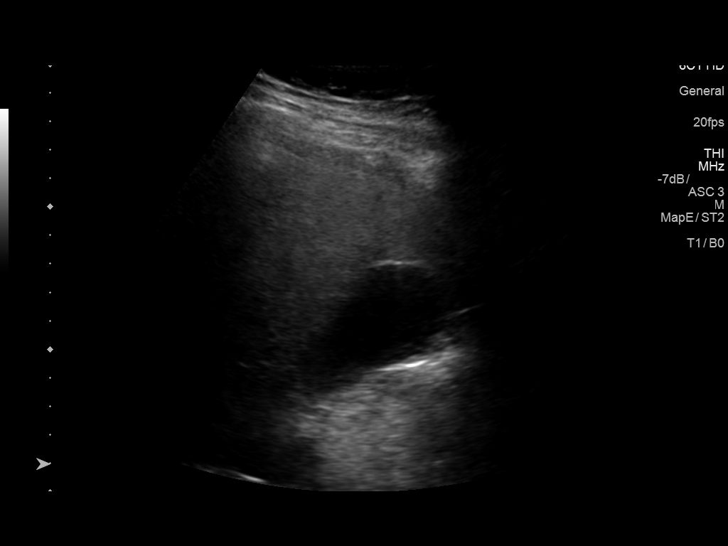
[im 13/35]
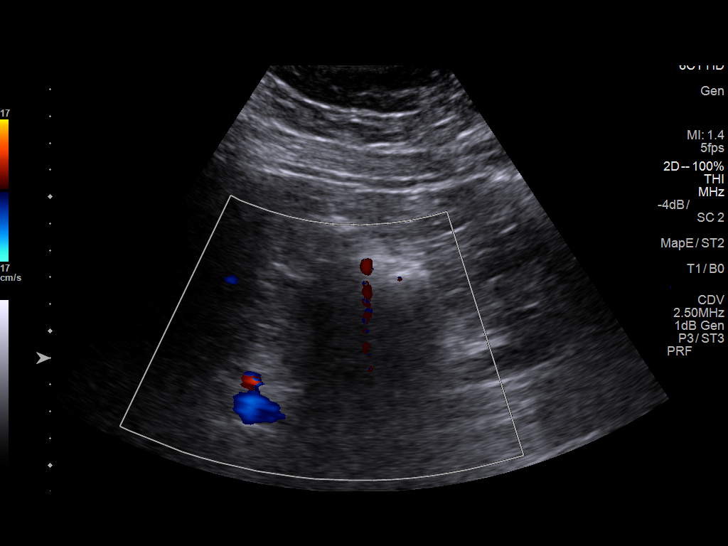
[im 16/35]
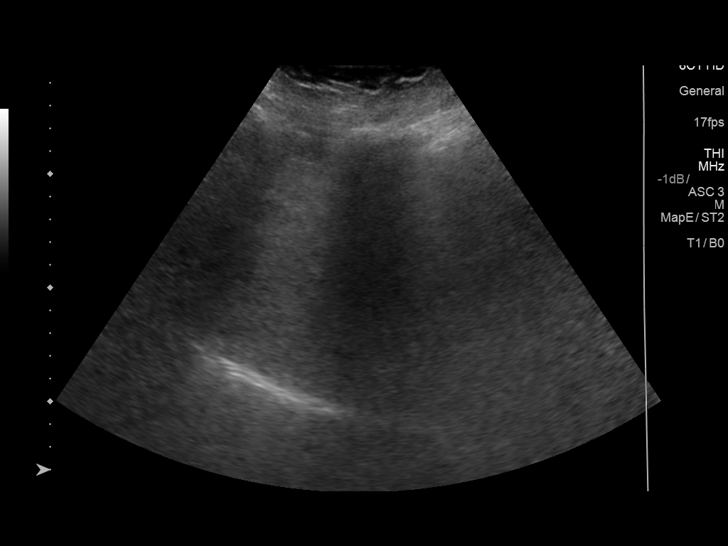
[im 19/35]
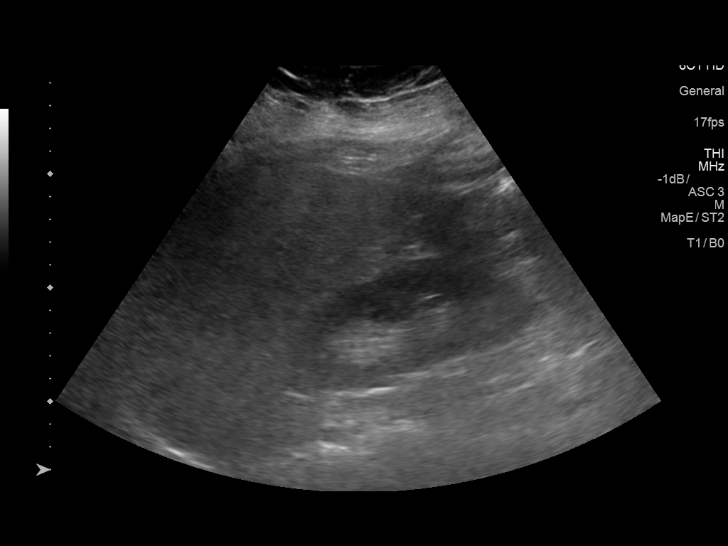
[im 22/35]
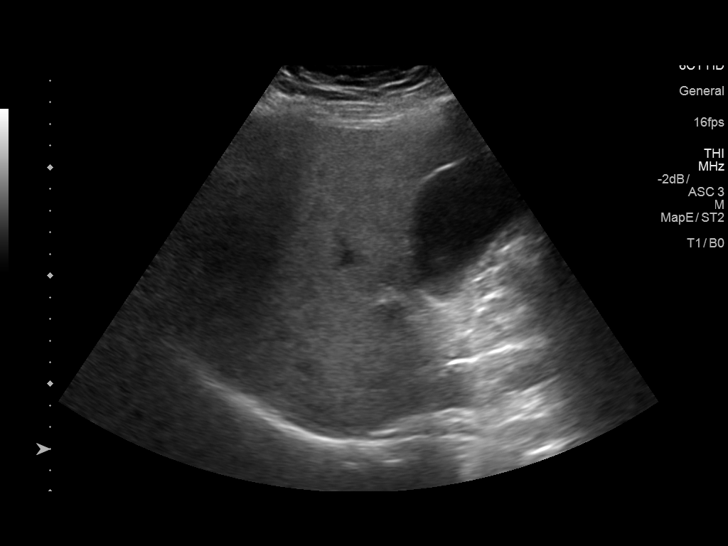
[im 23/35]
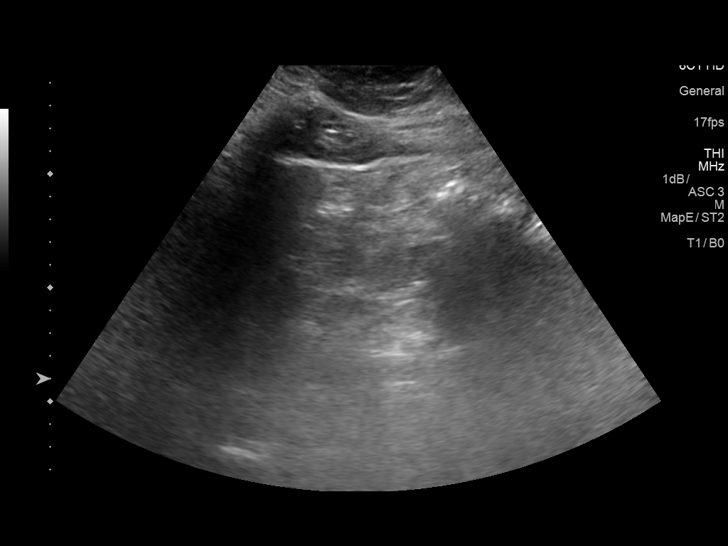
[im 26/35]
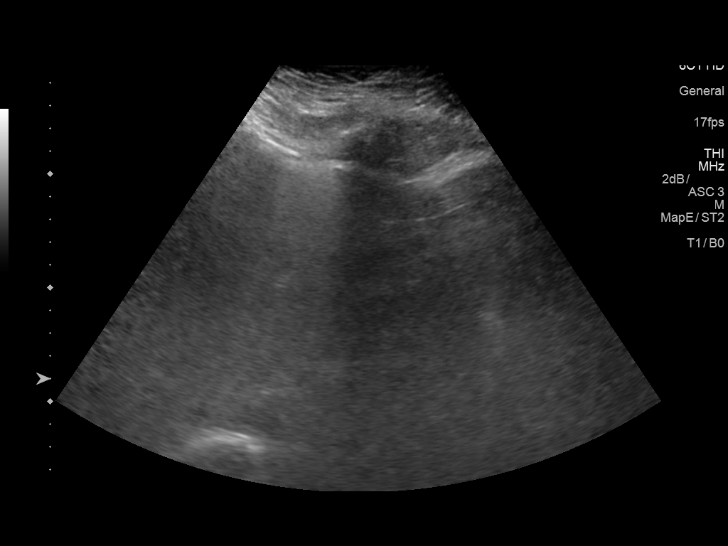
[im 29/35]
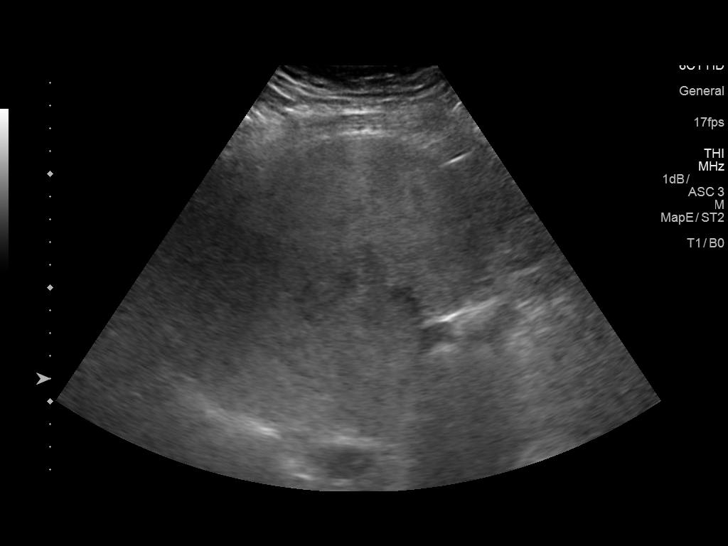
[im 32/35]
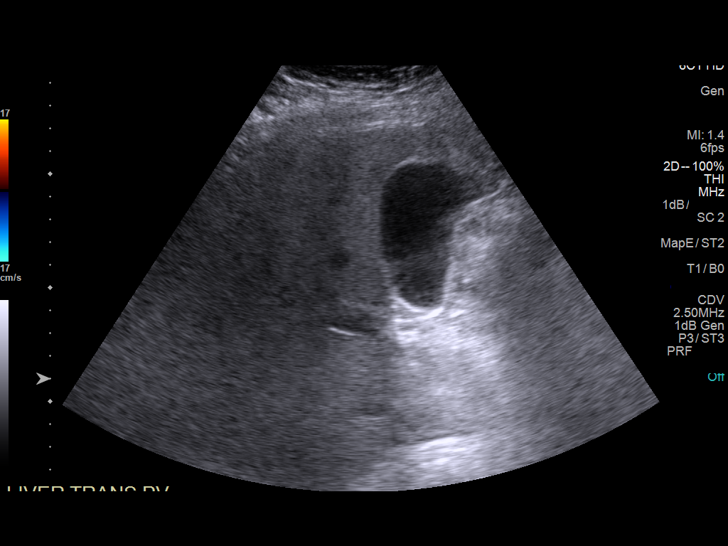
[im 35/35]
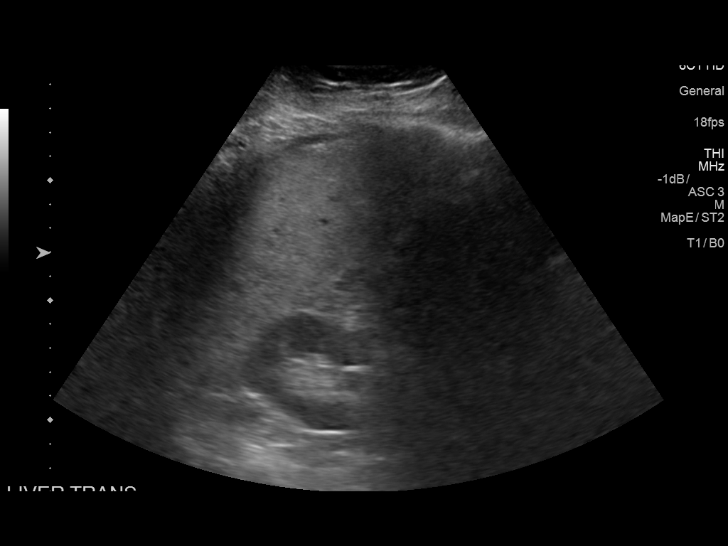

[14 of 25 positions shown; findings below may reference images not displayed]

FINDINGS: Gallbladder:

No gallstones or wall thickening visualized. No sonographic Murphy
sign noted by sonographer.

Common bile duct:

Diameter: 3.2 mm

Liver:

No focal lesion identified. Diffuse increased echotexture of the
liver is identified. Portal vein is patent on color Doppler imaging
with normal direction of blood flow towards the liver.
IMPRESSION: Diffuse increased echotexture of the liver, nonspecific but can be
seen in fatty infiltration of liver.

## 2019-12-30 ENCOUNTER — Telehealth: Payer: Self-pay | Admitting: Family Medicine

## 2019-12-30 ENCOUNTER — Other Ambulatory Visit: Payer: Self-pay | Admitting: Internal Medicine

## 2019-12-30 DIAGNOSIS — Z7251 High risk heterosexual behavior: Secondary | ICD-10-CM

## 2019-12-30 NOTE — Telephone Encounter (Signed)
There is already standing orders for HIV and RPR since he is suppose to come in every 3 months.   Called pt to advise him he can call back and schedule

## 2019-12-30 NOTE — Telephone Encounter (Signed)
Pt called and states he needs to come in for labs for the medication Truvada and lipids. I see Lipids. Please advise pt and please labs if ok. Pt can be reached at [phone]

## 2019-12-30 NOTE — Telephone Encounter (Signed)
Ok to order HIV in addition to the lipids that are in the system.

## 2020-01-07 ENCOUNTER — Other Ambulatory Visit: Payer: BC Managed Care – PPO

## 2020-01-07 ENCOUNTER — Other Ambulatory Visit: Payer: Self-pay

## 2020-01-07 DIAGNOSIS — E782 Mixed hyperlipidemia: Secondary | ICD-10-CM

## 2020-01-07 DIAGNOSIS — Z7251 High risk heterosexual behavior: Secondary | ICD-10-CM

## 2020-01-08 ENCOUNTER — Encounter: Payer: Self-pay | Admitting: Family Medicine

## 2020-01-08 ENCOUNTER — Other Ambulatory Visit: Payer: Self-pay | Admitting: Internal Medicine

## 2020-01-08 LAB — RPR: RPR Ser Ql: NONREACTIVE

## 2020-01-08 LAB — LIPID PANEL
Chol/HDL Ratio: 4 ratio (ref 0.0–5.0)
Cholesterol, Total: 141 mg/dL (ref 100–199)
HDL: 35 mg/dL — ABNORMAL LOW (ref >39)
LDL Chol Calc (NIH): 76 mg/dL (ref 0–99)
Triglycerides: 175 mg/dL — ABNORMAL HIGH (ref 0–149)
VLDL Cholesterol Cal: 30 mg/dL (ref 5–40)

## 2020-01-08 LAB — HIV ANTIBODY (ROUTINE TESTING W REFLEX): HIV Screen 4th Generation wRfx: NONREACTIVE

## 2020-01-08 MED ORDER — ATORVASTATIN CALCIUM 10 MG PO TABS: 10.0000 mg | ORAL_TABLET | Freq: Every day | ORAL | 1 refills | Status: DC

## 2020-01-08 MED ORDER — EMTRICITABINE-TENOFOVIR DF 200-300 MG PO TABS: 1.0000 | ORAL_TABLET | Freq: Every day | ORAL | 0 refills | Status: DC

## 2020-01-08 NOTE — Progress Notes (Signed)
Please refill the needed medication(s). Negative HIV. Cholesterol has improved.

## 2020-03-31 NOTE — Progress Notes (Signed)
Subjective:    Patient ID: Andrew Harrington, male    DOB: [birth year], 44 y.o.   MRN: [id number]  HPI Chief Complaint  Patient presents with  . fasting cpe    fasting cpe   He is here for a complete physical exam.  Dr. Henrene Pastor - GI  Dr. Irene Limbo- oncology Dermatologist- [facility]   He is taking Truvada daily for PrEP.   HL- reports taking atorvastatin 10 mg daily and fish oil. No side effects.   He reports gaining a significant amount of weight over the past year due to being sedentary.   Saw GI in 2020 for elevated LFTs and fatty liver.    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  Reviewed allergies, medications, past medical, surgical, family, and social history.   Review of Systems Review of Systems Constitutional: -fever, -chills, -sweats, -unexpected weight change,-fatigue ENT: -runny nose, -ear pain, -sore throat Cardiology:  -chest pain, -palpitations, -edema Respiratory: -cough, -shortness of breath, -wheezing Gastroenterology: -abdominal pain, -nausea, -vomiting, -diarrhea, -constipation  Hematology: -bleeding or bruising problems Musculoskeletal: -arthralgias, -myalgias, -joint swelling, -back pain Ophthalmology: -vision changes Urology: -dysuria, -difficulty urinating, -hematuria, -urinary frequency, -urgency Neurology: -headache, -weakness, -tingling, -numbness       Objective:   Physical Exam BP 120/80   Pulse 70   Ht 6\' 2"  (1.88 m)   Wt (!) 348 lb 3.2 oz (157.9 kg)   BMI 44.71 kg/m    General Appearance:    Alert, cooperative, no distress, appears stated age  Head:    Normocephalic, without obvious abnormality, atraumatic  Eyes:    PERRL, conjunctiva/corneas clear, EOM's intact  Ears:    Normal TM's and external ear canals  Nose:   Mask in place   Throat:   Mask in place   Neck:   Supple, no lymphadenopathy;  thyroid:  no   enlargement/tenderness/nodules; no JVD  Back:    Spine nontender, no curvature, ROM normal, no CVA     tenderness  Lungs:     Clear to auscultation bilaterally without wheezes, rales or     ronchi; respirations unlabored  Chest Wall:    No tenderness or deformity   Heart:    Regular rate and rhythm, S1 and S2 normal, no murmur, rub   or gallop  Breast Exam:    No chest wall tenderness, masses or gynecomastia  Abdomen:     Soft, non-tender, nondistended, normoactive bowel sounds,    no palpable masses  Genitalia:   declines      Extremities:   No clubbing, cyanosis or edema  Pulses:   2+ and symmetric all extremities  Skin:   Skin color, texture, turgor normal, no rashes or lesions  Lymph nodes:   Cervical, supraclavicular, and axillary nodes normal  Neurologic:   CNII-XII intact, normal strength, sensation and gait          Psych:   Normal mood, affect, hygiene and grooming.         Assessment & Plan:  Routine general medical examination at a health care facility - Plan:  CBC with Differential/Platelet, Comprehensive metabolic panel -preventive health care reviewed and he has upcoming appts for dental and eyes. 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 Immunizations reviewed. Counseling on healthy lifestyle. Discussed safety  Class 3 severe obesity with body mass index (BMI) of 40.0 to 44.9 in adult, unspecified obesity type, unspecified whether serious comorbidity present ([place]) - Plan: TSH, T4, free, Lipid panel, Hemoglobin A1c -counseling on eating a healthier diet and cutting back on portion  sizes. Also recommend using a free app such as My Fitness Pal. increase exercise. Consider medication if A1c is elevated  Mixed hyperlipidemia - Plan: Lipid panel -continue statin and fish oil and follow up pending lipid panel  Family history of diabetes mellitus (DM) - Plan: Hemoglobin A1c -he has had prediabetes in the past but last A1c was normal. Recent weight gain which increases his risk of diabetes. Check A1c and follow up  Medication management - Plan: HIV Antibody (routine testing w rflx) -continue PrEP pending HIV result.

## 2020-03-31 NOTE — Patient Instructions (Addendum)
Try to cut back on portion sizes and carbohydrates such as bread, pasta, rice, potatoes.  Increase your physical activity. I also recommend using a free app such as my fitness pal to track calories and carbohydrates.  I will be in touch with your lab results.   Preventive Care 42-42 Years Old, Male Preventive care refers to lifestyle choices and visits with your health care provider that can promote health and wellness. This includes:  A yearly physical exam. This is also called an annual well check.  Regular dental and eye exams.  Immunizations.  Screening for certain conditions.  Healthy lifestyle choices, such as eating a healthy diet, getting regular exercise, not using drugs or products that contain nicotine and tobacco, and limiting alcohol use. What can I expect for my preventive care visit? Physical exam Your health care provider will check:  Height and weight. These may be used to calculate body mass index (BMI), which is a measurement that tells if you are at a healthy weight.  Heart rate and blood pressure.  Your skin for abnormal spots. Counseling Your health care provider may ask you questions about:  Alcohol, tobacco, and drug use.  Emotional well-being.  Home and relationship well-being.  Sexual activity.  Eating habits.  Work and work Statistician. What immunizations do I need?  Influenza (flu) vaccine  This is recommended every year. Tetanus, diphtheria, and pertussis (Tdap) vaccine  You may need a Td booster every 10 years. Varicella (chickenpox) vaccine  You may need this vaccine if you have not already been vaccinated. Zoster (shingles) vaccine  You may need this after age 30. Measles, mumps, and rubella (MMR) vaccine  You may need at least one dose of MMR if you were born in 1957 or later. You may also need a second dose. Pneumococcal conjugate (PCV13) vaccine  You may need this if you have certain conditions and were not previously  vaccinated. Pneumococcal polysaccharide (PPSV23) vaccine  You may need one or two doses if you smoke cigarettes or if you have certain conditions. Meningococcal conjugate (MenACWY) vaccine  You may need this if you have certain conditions. Hepatitis A vaccine  You may need this if you have certain conditions or if you travel or work in places where you may be exposed to hepatitis A. Hepatitis B vaccine  You may need this if you have certain conditions or if you travel or work in places where you may be exposed to hepatitis B. Haemophilus influenzae type b (Hib) vaccine  You may need this if you have certain risk factors. Human papillomavirus (HPV) vaccine  If recommended by your health care provider, you may need three doses over 6 months. You may receive vaccines as individual doses or as more than one vaccine together in one shot (combination vaccines). Talk with your health care provider about the risks and benefits of combination vaccines. What tests do I need? Blood tests  Lipid and cholesterol levels. These may be checked every 5 years, or more frequently if you are over 74 years old.  Hepatitis C test.  Hepatitis B test. Screening  Lung cancer screening. You may have this screening every year starting at age 73 if you have a 30-pack-year history of smoking and currently smoke or have quit within the past 15 years.  Prostate cancer screening. Recommendations will vary depending on your family history and other risks.  Colorectal cancer screening. All adults should have this screening starting at age 52 and continuing until age 58. Your  health care provider may recommend screening at age 45 if you are at increased risk. You will have tests every 1-10 years, depending on your results and the type of screening test.  Diabetes screening. This is done by checking your blood sugar (glucose) after you have not eaten for a while (fasting). You may have this done every 1-3  years.  Sexually transmitted disease (STD) testing. Follow these instructions at home: Eating and drinking  Eat a diet that includes fresh fruits and vegetables, whole grains, lean protein, and low-fat dairy products.  Take vitamin and mineral supplements as recommended by your health care provider.  Do not drink alcohol if your health care provider tells you not to drink.  If you drink alcohol: ? Limit how much you have to 0-2 drinks a day. ? Be aware of how much alcohol is in your drink. In the [place] one drink equals one 12 oz bottle of beer (355 mL), one 5 oz glass of wine (148 mL), or one 1 oz glass of hard liquor (44 mL). Lifestyle  Take daily care of your teeth and gums.  Stay active. Exercise for at least 30 minutes on 5 or more days each week.  Do not use any products that contain nicotine or tobacco, such as cigarettes, e-cigarettes, and chewing tobacco. If you need help quitting, ask your health care provider.  If you are sexually active, practice safe sex. Use a condom or other form of protection to prevent STIs (sexually transmitted infections).  Talk with your health care provider about taking a low-dose aspirin every day starting at age 50. What's next?  Go to your health care provider once a year for a well check visit.  Ask your health care provider how often you should have your eyes and teeth checked.  Stay up to date on all vaccines. This information is not intended to replace advice given to you by your health care provider. Make sure you discuss any questions you have with your health care provider. Document Revised: 07/31/2018 Document Reviewed: 07/31/2018 Elsevier Patient Education  2020 [employer].  

## 2020-04-01 ENCOUNTER — Other Ambulatory Visit: Payer: Self-pay

## 2020-04-01 ENCOUNTER — Encounter: Payer: Self-pay | Admitting: Family Medicine

## 2020-04-01 ENCOUNTER — Ambulatory Visit: Payer: BC Managed Care – PPO | Admitting: Family Medicine

## 2020-04-01 VITALS — BP 120/80 | HR 70 | Ht 74.0 in | Wt 348.2 lb

## 2020-04-01 DIAGNOSIS — Z0000 Encounter for general adult medical examination without abnormal findings: Secondary | ICD-10-CM | POA: Diagnosis not present

## 2020-04-01 DIAGNOSIS — Z79899 Other long term (current) drug therapy: Secondary | ICD-10-CM

## 2020-04-01 DIAGNOSIS — E782 Mixed hyperlipidemia: Secondary | ICD-10-CM | POA: Diagnosis not present

## 2020-04-01 DIAGNOSIS — Z833 Family history of diabetes mellitus: Secondary | ICD-10-CM

## 2020-04-01 DIAGNOSIS — Z6841 Body Mass Index (BMI) 40.0 and over, adult: Secondary | ICD-10-CM

## 2020-04-02 LAB — LIPID PANEL
Chol/HDL Ratio: 4.2 ratio (ref 0.0–5.0)
Cholesterol, Total: 151 mg/dL (ref 100–199)
HDL: 36 mg/dL — ABNORMAL LOW (ref >39)
LDL Chol Calc (NIH): 89 mg/dL (ref 0–99)
Triglycerides: 145 mg/dL (ref 0–149)
VLDL Cholesterol Cal: 26 mg/dL (ref 5–40)

## 2020-04-02 LAB — TSH: TSH: 0.928 u[IU]/mL (ref 0.450–4.500)

## 2020-04-02 LAB — CBC WITH DIFFERENTIAL/PLATELET
Basophils Absolute: 0.1 10*3/uL (ref 0.0–0.2)
Basos: 1 %
EOS (ABSOLUTE): 0.2 10*3/uL (ref 0.0–0.4)
Eos: 2 %
Hematocrit: 41.7 % (ref 37.5–51.0)
Hemoglobin: 14 g/dL (ref 13.0–17.7)
Immature Grans (Abs): 0 10*3/uL (ref 0.0–0.1)
Immature Granulocytes: 0 %
Lymphocytes Absolute: 2 10*3/uL (ref 0.7–3.1)
Lymphs: 32 %
MCH: 30 pg (ref 26.6–33.0)
MCHC: 33.6 g/dL (ref 31.5–35.7)
MCV: 89 fL (ref 79–97)
Monocytes Absolute: 0.4 10*3/uL (ref 0.1–0.9)
Monocytes: 6 %
Neutrophils Absolute: 3.7 10*3/uL (ref 1.4–7.0)
Neutrophils: 59 %
Platelets: 293 10*3/uL (ref 150–450)
RBC: 4.67 x10E6/uL (ref 4.14–5.80)
RDW: 12.6 % (ref 11.6–15.4)
WBC: 6.3 10*3/uL (ref 3.4–10.8)

## 2020-04-02 LAB — COMPREHENSIVE METABOLIC PANEL
ALT: 17 IU/L (ref 0–44)
AST: 15 IU/L (ref 0–40)
Albumin/Globulin Ratio: 1.7 (ref 1.2–2.2)
Albumin: 4.5 g/dL (ref 4.0–5.0)
Alkaline Phosphatase: 103 IU/L (ref 48–121)
BUN/Creatinine Ratio: 8 — ABNORMAL LOW (ref 9–20)
BUN: 8 mg/dL (ref 6–24)
Bilirubin Total: 0.4 mg/dL (ref 0.0–1.2)
CO2: 23 mmol/L (ref 20–29)
Calcium: 9.4 mg/dL (ref 8.7–10.2)
Chloride: 101 mmol/L (ref 96–106)
Creatinine, Ser: 0.99 mg/dL (ref 0.76–1.27)
GFR calc Af Amer: 108 mL/min/{1.73_m2} (ref >59)
GFR calc non Af Amer: 94 mL/min/{1.73_m2} (ref >59)
Globulin, Total: 2.7 g/dL (ref 1.5–4.5)
Glucose: 99 mg/dL (ref 65–99)
Potassium: 4.5 mmol/L (ref 3.5–5.2)
Sodium: 137 mmol/L (ref 134–144)
Total Protein: 7.2 g/dL (ref 6.0–8.5)

## 2020-04-02 LAB — HIV ANTIBODY (ROUTINE TESTING W REFLEX): HIV Screen 4th Generation wRfx: NONREACTIVE

## 2020-04-02 LAB — T4, FREE: Free T4: 1.17 ng/dL (ref 0.82–1.77)

## 2020-04-02 LAB — HEMOGLOBIN A1C
Est. average glucose Bld gHb Est-mCnc: 114 mg/dL
Hgb A1c MFr Bld: 5.6 % (ref 4.8–5.6)

## 2020-04-03 NOTE — Progress Notes (Signed)
Ok to refill Truvada for 3 months.  Negative HIV test.

## 2020-04-04 ENCOUNTER — Other Ambulatory Visit: Payer: Self-pay | Admitting: Internal Medicine

## 2020-04-04 MED ORDER — EMTRICITABINE-TENOFOVIR DF 200-300 MG PO TABS: 1.0000 | ORAL_TABLET | Freq: Every day | ORAL | 0 refills | Status: DC

## 2020-04-10 ENCOUNTER — Encounter: Payer: Self-pay | Admitting: Family Medicine

## 2020-04-11 MED ORDER — ATORVASTATIN CALCIUM 10 MG PO TABS: 10.0000 mg | ORAL_TABLET | Freq: Every day | ORAL | 1 refills | Status: DC

## 2020-06-23 ENCOUNTER — Other Ambulatory Visit: Payer: Self-pay | Admitting: Family Medicine

## 2020-06-30 ENCOUNTER — Other Ambulatory Visit: Payer: BC Managed Care – PPO

## 2020-06-30 ENCOUNTER — Other Ambulatory Visit: Payer: Self-pay

## 2020-06-30 DIAGNOSIS — Z7251 High risk heterosexual behavior: Secondary | ICD-10-CM

## 2020-07-01 ENCOUNTER — Other Ambulatory Visit: Payer: Self-pay | Admitting: Internal Medicine

## 2020-07-01 DIAGNOSIS — Z7251 High risk heterosexual behavior: Secondary | ICD-10-CM

## 2020-07-01 LAB — RPR: RPR Ser Ql: NONREACTIVE

## 2020-07-01 LAB — HIV ANTIBODY (ROUTINE TESTING W REFLEX): HIV Screen 4th Generation wRfx: NONREACTIVE

## 2020-07-01 MED ORDER — EMTRICITABINE-TENOFOVIR DF 200-300 MG PO TABS: 1.0000 | ORAL_TABLET | Freq: Every day | ORAL | 0 refills | Status: DC

## 2020-07-01 NOTE — Progress Notes (Signed)
Ok to refill Truvada for 3 months.

## 2020-09-19 ENCOUNTER — Other Ambulatory Visit: Payer: Self-pay | Admitting: Family Medicine

## 2020-09-19 NOTE — Telephone Encounter (Signed)
Pt has an appt at end of February with you. Ok to refill. He should run out mid february

## 2020-09-19 NOTE — Telephone Encounter (Signed)
We need to screen for HIV before he runs out so I can refill.

## 2020-09-19 NOTE — Telephone Encounter (Signed)
Left message for pt to call and schedule lab visit

## 2020-10-06 ENCOUNTER — Encounter: Payer: Self-pay | Admitting: Family Medicine

## 2020-10-06 ENCOUNTER — Other Ambulatory Visit: Payer: Self-pay | Admitting: Family Medicine

## 2020-10-06 NOTE — Telephone Encounter (Signed)
Can't give him refills until we have repeat HIV test result that is negative. Please have him come in for this so that he does not run out of medication.

## 2020-10-06 NOTE — Telephone Encounter (Signed)
Left message for pt to call back to schedule nurse visit for HIV

## 2020-10-06 NOTE — Telephone Encounter (Signed)
Pt has an appt on 2/21. Pt last got med on 07/01/20 # 90

## 2020-10-10 ENCOUNTER — Encounter: Payer: Self-pay | Admitting: Family Medicine

## 2020-10-10 ENCOUNTER — Ambulatory Visit: Payer: BC Managed Care – PPO | Admitting: Family Medicine

## 2020-10-10 ENCOUNTER — Other Ambulatory Visit: Payer: Self-pay

## 2020-10-10 ENCOUNTER — Encounter: Payer: BC Managed Care – PPO | Admitting: Family Medicine

## 2020-10-10 VITALS — BP 120/70 | HR 79 | Wt 349.8 lb

## 2020-10-10 DIAGNOSIS — E782 Mixed hyperlipidemia: Secondary | ICD-10-CM

## 2020-10-10 DIAGNOSIS — Z79899 Other long term (current) drug therapy: Secondary | ICD-10-CM | POA: Diagnosis not present

## 2020-10-10 DIAGNOSIS — Z6841 Body Mass Index (BMI) 40.0 and over, adult: Secondary | ICD-10-CM

## 2020-10-10 DIAGNOSIS — Z7251 High risk heterosexual behavior: Secondary | ICD-10-CM

## 2020-10-10 NOTE — Progress Notes (Signed)
   Subjective:    Patient ID: Andrew Harrington, male    DOB: Oct 24, 1975, 45 y.o.   MRN: [id number]  HPI Chief Complaint  Patient presents with  . med check    Med check    He is here for medication management visit.  He is taking PrEP and doing fine.  Due for HIV testing.  He is actually due for complete STD panel and we discussed this.  Denies any new sexual partners.  Denies any symptoms.  He is fasting today and would like to recheck his cholesterol.  Reports taking statin daily without any issues.  Denies fever, chills, dizziness, chest pain, palpitations, shortness of breath, abdominal pain, N/V/D, or urinary symptoms.   Reviewed allergies, medications, past medical, surgical, family, and social history.    Review of Systems Pertinent positives and negatives in the history of present illness.      Objective:   Physical Exam BP 120/70   Pulse 79   Wt (!) 349 lb 12.8 oz (158.7 kg)   BMI 44.91 kg/m   Alert and oriented and in no acute distress.  Respirations unlabored.  Normal mood.      Assessment & Plan:  High risk sexual behavior, unspecified type - Plan: HIV Antibody (routine testing w rflx), RPR, GC/Chlamydia Probe Amp, Lipid panel  Medication management - Plan: HIV Antibody (routine testing w rflx), RPR, GC/Chlamydia Probe Amp, Lipid panel  Mixed hyperlipidemia - Plan: Lipid panel  Class 3 severe obesity with body mass index (BMI) of 40.0 to 44.9 in adult, unspecified obesity type, unspecified whether serious comorbidity present [facility])  He appears to be doing well and is in good spirits.  I will check HIV status and other STD testing.  Refill Truvada as appropriate. He is fasting so I will check his lipid panel.  Continue statin therapy. Recommend low-fat, low-carb diet and getting at least 150 minutes of physical activity each week.  He will follow-up with me when due for his CPE in August.  He will need a lab visit in 3 months for HIV testing only.

## 2020-10-11 ENCOUNTER — Other Ambulatory Visit: Payer: Self-pay | Admitting: Internal Medicine

## 2020-10-11 LAB — LIPID PANEL
Chol/HDL Ratio: 3.8 ratio (ref 0.0–5.0)
Cholesterol, Total: 152 mg/dL (ref 100–199)
HDL: 40 mg/dL (ref >39)
LDL Chol Calc (NIH): 80 mg/dL (ref 0–99)
Triglycerides: 191 mg/dL — ABNORMAL HIGH (ref 0–149)
VLDL Cholesterol Cal: 32 mg/dL (ref 5–40)

## 2020-10-11 LAB — HIV ANTIBODY (ROUTINE TESTING W REFLEX): HIV Screen 4th Generation wRfx: NONREACTIVE

## 2020-10-11 LAB — GC/CHLAMYDIA PROBE AMP
Chlamydia trachomatis, NAA: NEGATIVE
Neisseria Gonorrhoeae by PCR: NEGATIVE

## 2020-10-11 LAB — RPR: RPR Ser Ql: NONREACTIVE

## 2020-10-11 MED ORDER — EMTRICITABINE-TENOFOVIR DF 200-300 MG PO TABS: 1.0000 | ORAL_TABLET | Freq: Every day | ORAL | 0 refills | Status: DC

## 2020-10-11 NOTE — Progress Notes (Signed)
Please refill his Truvada for 3 months.

## 2020-10-17 ENCOUNTER — Other Ambulatory Visit: Payer: Self-pay | Admitting: *Deleted

## 2020-10-17 MED ORDER — ATORVASTATIN CALCIUM 10 MG PO TABS: 10.0000 mg | ORAL_TABLET | Freq: Every day | ORAL | 0 refills | Status: DC

## 2020-12-22 ENCOUNTER — Other Ambulatory Visit: Payer: Self-pay | Admitting: Family Medicine

## 2020-12-22 NOTE — Telephone Encounter (Signed)
Pt as an appt later on this month

## 2021-01-09 ENCOUNTER — Other Ambulatory Visit: Payer: BC Managed Care – PPO

## 2021-01-09 ENCOUNTER — Other Ambulatory Visit: Payer: Self-pay

## 2021-01-09 DIAGNOSIS — Z7251 High risk heterosexual behavior: Secondary | ICD-10-CM

## 2021-01-10 LAB — HIV ANTIBODY (ROUTINE TESTING W REFLEX): HIV Screen 4th Generation wRfx: NONREACTIVE

## 2021-01-10 LAB — RPR: RPR Ser Ql: NONREACTIVE

## 2021-01-10 NOTE — Progress Notes (Signed)
Negative HIV and syphilis testing. Ok to refill Truvada for 3 months.

## 2021-01-11 ENCOUNTER — Other Ambulatory Visit: Payer: Self-pay | Admitting: Internal Medicine

## 2021-01-11 MED ORDER — EMTRICITABINE-TENOFOVIR DF 200-300 MG PO TABS: 1.0000 | ORAL_TABLET | Freq: Every day | ORAL | 0 refills | Status: DC

## 2021-02-15 ENCOUNTER — Encounter: Payer: Self-pay | Admitting: Internal Medicine

## 2021-03-15 ENCOUNTER — Telehealth: Payer: Self-pay | Admitting: Family Medicine

## 2021-03-15 DIAGNOSIS — Z79899 Other long term (current) drug therapy: Secondary | ICD-10-CM

## 2021-03-15 DIAGNOSIS — Z7251 High risk heterosexual behavior: Secondary | ICD-10-CM

## 2021-03-15 NOTE — Telephone Encounter (Signed)
Pt called and needs to come in 08/15 to get his lab work for prep and his cholosterol if that is ok States he normally comes in for that every so often for vickie, if this is ok

## 2021-03-21 ENCOUNTER — Other Ambulatory Visit: Payer: Self-pay | Admitting: Family Medicine

## 2021-03-21 NOTE — Telephone Encounter (Signed)
Pt has upcoming appt with Dr. Redmond School

## 2021-04-03 ENCOUNTER — Other Ambulatory Visit: Payer: Self-pay

## 2021-04-03 ENCOUNTER — Ambulatory Visit: Payer: BC Managed Care – PPO | Admitting: Family Medicine

## 2021-04-03 VITALS — BP 132/86 | HR 81 | Temp 97.7°F | Wt 350.0 lb

## 2021-04-03 DIAGNOSIS — E782 Mixed hyperlipidemia: Secondary | ICD-10-CM | POA: Diagnosis not present

## 2021-04-03 DIAGNOSIS — Z209 Contact with and (suspected) exposure to unspecified communicable disease: Secondary | ICD-10-CM

## 2021-04-03 NOTE — Progress Notes (Signed)
   Subjective:    Patient ID: Andrew Harrington, male    DOB: [birth year], 45 y.o.   MRN: [id number]  HPI He is here for a med check.  He continues on Truvada as well as Lipitor and is having no difficulty with either 1.  He is in a monogamous relationship but would still like to be on Truvada.  He recently did set himself up for the smoking pox vaccine but again states that he is in a monogamous relationship.   Review of Systems     Objective:   Physical Exam Alert and in no distress otherwise not examined       Assessment & Plan:  Contact with or exposure to communicable disease - Plan: RPR+HIV+GC+CT Panel  Mixed hyperlipidemia Continue on his present medication regimen.

## 2021-04-04 ENCOUNTER — Encounter: Payer: BC Managed Care – PPO | Admitting: Family Medicine

## 2021-04-05 LAB — RPR+HIV+GC+CT PANEL
Chlamydia trachomatis, NAA: NEGATIVE
HIV Screen 4th Generation wRfx: NONREACTIVE
Neisseria Gonorrhoeae by PCR: NEGATIVE
RPR Ser Ql: NONREACTIVE

## 2021-04-05 MED ORDER — EMTRICITABINE-TENOFOVIR DF 200-300 MG PO TABS
1.0000 | ORAL_TABLET | Freq: Every day | ORAL | 0 refills | Status: DC
Start: 2021-04-05 — End: 2021-06-15

## 2021-04-05 NOTE — Addendum Note (Signed)
Addended by: Denita Lung on: 04/05/2021 12:49 PM   Modules accepted: Orders

## 2021-04-11 ENCOUNTER — Other Ambulatory Visit: Payer: Self-pay | Admitting: Family Medicine

## 2021-04-11 ENCOUNTER — Encounter: Payer: Self-pay | Admitting: Family Medicine

## 2021-04-11 MED ORDER — ATORVASTATIN CALCIUM 10 MG PO TABS: 10.0000 mg | ORAL_TABLET | Freq: Every day | ORAL | 1 refills | Status: DC

## 2021-06-15 ENCOUNTER — Other Ambulatory Visit: Payer: Self-pay | Admitting: Family Medicine

## 2021-06-15 NOTE — Telephone Encounter (Signed)
[facility] is requesting to fill pt truvada. Please advise [facility] °

## 2021-06-30 ENCOUNTER — Other Ambulatory Visit: Payer: Self-pay

## 2021-06-30 ENCOUNTER — Encounter: Payer: Self-pay | Admitting: Family Medicine

## 2021-06-30 ENCOUNTER — Ambulatory Visit: Payer: BC Managed Care – PPO | Admitting: Family Medicine

## 2021-06-30 VITALS — BP 122/80 | HR 72 | Ht 73.0 in | Wt 340.6 lb

## 2021-06-30 DIAGNOSIS — Z209 Contact with and (suspected) exposure to unspecified communicable disease: Secondary | ICD-10-CM

## 2021-06-30 NOTE — Progress Notes (Signed)
   Subjective:    Patient ID: Andrew Harrington, male    DOB: 07/11/1976, 45 y.o.   MRN: [id number]  HPI He is here for recheck on his Truvada.  He has had no difficulties with.  No urinary discharge, rashes.  He has no other concerns or complaints.   Review of Systems     Objective:   Physical Exam Alert and in no distress otherwise not examined.  Review of record       Assessment & Plan:  Contact with or exposure to communicable disease - Plan: RPR+HIV+GC+CT Panel

## 2021-07-03 LAB — RPR+HIV+GC+CT PANEL
Chlamydia trachomatis, NAA: NEGATIVE
HIV Screen 4th Generation wRfx: NONREACTIVE
Neisseria Gonorrhoeae by PCR: NEGATIVE
RPR Ser Ql: NONREACTIVE

## 2021-07-04 MED ORDER — EMTRICITABINE-TENOFOVIR DF 200-300 MG PO TABS: ORAL_TABLET | ORAL | 0 refills | Status: DC

## 2021-07-04 NOTE — Addendum Note (Signed)
Addended by: Denita Lung on: 07/04/2021 08:14 AM   Modules accepted: Orders

## 2021-07-08 ENCOUNTER — Encounter: Payer: Self-pay | Admitting: Family Medicine

## 2021-07-10 ENCOUNTER — Other Ambulatory Visit: Payer: Self-pay

## 2021-07-10 MED ORDER — ATORVASTATIN CALCIUM 10 MG PO TABS
10.0000 mg | ORAL_TABLET | Freq: Every day | ORAL | 1 refills | Status: DC
Start: 2021-07-10 — End: 2021-10-09

## 2021-09-13 ENCOUNTER — Other Ambulatory Visit: Payer: Self-pay | Admitting: Family Medicine

## 2021-09-13 NOTE — Telephone Encounter (Signed)
[facility] is requesting to fill pt tr[facility]ada. Last ov 06/30/21. Please advise [facility]

## 2021-10-09 ENCOUNTER — Encounter: Payer: Self-pay | Admitting: Physician Assistant

## 2021-10-09 ENCOUNTER — Ambulatory Visit: Payer: BC Managed Care – PPO | Admitting: Physician Assistant

## 2021-10-09 ENCOUNTER — Other Ambulatory Visit: Payer: Self-pay

## 2021-10-09 VITALS — BP 128/84 | HR 72 | Resp 18 | Ht 74.0 in | Wt 345.0 lb

## 2021-10-09 DIAGNOSIS — Z7251 High risk heterosexual behavior: Secondary | ICD-10-CM | POA: Diagnosis not present

## 2021-10-09 DIAGNOSIS — E782 Mixed hyperlipidemia: Secondary | ICD-10-CM

## 2021-10-09 DIAGNOSIS — R7303 Prediabetes: Secondary | ICD-10-CM

## 2021-10-09 DIAGNOSIS — Z209 Contact with and (suspected) exposure to unspecified communicable disease: Secondary | ICD-10-CM | POA: Insufficient documentation

## 2021-10-09 DIAGNOSIS — R748 Abnormal levels of other serum enzymes: Secondary | ICD-10-CM

## 2021-10-09 MED ORDER — ATORVASTATIN CALCIUM 10 MG PO TABS: 10.0000 mg | ORAL_TABLET | Freq: Every day | ORAL | 2 refills | Status: DC

## 2021-10-09 MED ORDER — EMTRICITABINE-TENOFOVIR DF 200-300 MG PO TABS: ORAL_TABLET | ORAL | 0 refills | Status: DC

## 2021-10-09 NOTE — Progress Notes (Signed)
Acute Office Visit  Subjective:    Patient ID: Andrew Harrington, male    DOB: October 17, 1975, 46 y.o.   MRN: [id number]  Chief Complaint  Patient presents with   Hyperlipidemia    Fasting med check, no concerns. Truvada goes to [facility] and at[facility]vastatin goes to [facility].     HPI Patient is in today for a follow up appointment. He denies any new issues; is fasting for routine labs to be done today.  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 No pertinent surgical history.  Family History  Problem Relation Age of Onset   Diabetes Mother    Aneurysm Father        repaired    Hypertension Maternal Grandmother    Alzheimer's disease Paternal Grandmother     Social History   Socioeconomic History   Marital status: Single    Spouse name: Not on file   Number of children: Not on file   Years of education: Not on file   Highest education level: Not on file  Occupational History   Not on file  Tobacco Use   Smoking status: Never   Smokeless tobacco: Never  Substance and Sexual Activity   Alcohol use: Yes    Comment: social   Drug use: No   Sexual activity: Yes    Partners: Male  Other Topics Concern   Not on file  Social History Narrative   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     Omega-3 Fatty Acids (FISH OIL) 1000 MG CAPS Take 1 capsule by mouth daily.     atorvastatin (LIPITOR) 10 MG tablet Take 1 tablet (10 mg total) by mouth daily. 90  tablet 1   emtricitabine-tenofovir (TRUVADA) 200-300 MG tablet TAKE 1 TABLET BY MOUTH 1 TIME A DAY. 90 tablet 0   No facility-administered medications prior to visit.    No Known Allergies  Review of Systems  Constitutional:  Negative for activity change, chills, fatigue and fever.  HENT:  Negative for congestion, ear pain, hearing loss and voice change.   Eyes:  Negative for pain and redness.  Respiratory:  Negative for cough and shortness of breath.   Cardiovascular:  Negative for leg swelling.  Gastrointestinal:  Negative for constipation, diarrhea, nausea and vomiting.  Endocrine: Negative for polyuria.  Genitourinary:  Negative for flank pain and frequency.  Musculoskeletal:  Negative for joint swelling and neck pain.  Skin:  Negative for rash.  Neurological:  Negative for dizziness.  Hematological:  Does not bruise/bleed easily.  Psychiatric/Behavioral:  Negative for agitation and behavioral problems.       Objective:    Physical Exam Vitals and nursing note reviewed.  Constitutional:      General: He is not in acute distress.    Appearance: Normal appearance.  HENT:     Head: Normocephalic and atraumatic.     Right Ear: External ear normal.     Left  Ear: External ear normal.     Nose: No congestion.  Eyes:     Extraocular Movements: Extraocular movements intact.     Conjunctiva/sclera: Conjunctivae normal.     Pupils: Pupils are equal, round, and reactive to light.  Cardiovascular:     Rate and Rhythm: Normal rate and regular rhythm.     Pulses: Normal pulses.     Heart sounds: Normal heart sounds.  Pulmonary:     Effort: Pulmonary effort is normal.     Breath sounds: Normal breath sounds. No wheezing.  Abdominal:     General: Bowel sounds are normal.     Palpations: Abdomen is soft.  Musculoskeletal:        General: Normal range of motion.     Cervical back: Normal range of motion and neck supple.     Right lower leg: No edema.     Left lower leg: No  edema.  Skin:    General: Skin is warm and dry.     Findings: No rash.  Neurological:     Mental Status: He is alert and oriented to person, place, and time.     Gait: Gait normal.  Psychiatric:        Mood and Affect: Mood normal.        Behavior: Behavior normal.    BP 128/84    Pulse 72    Resp 18    Ht 6\' 2"  (1.88 m)    Wt (!) 345 lb (156.5 kg)    BMI 44.30 kg/m   Wt Readings from Last 3 Encounters:  10/09/21 (!) 345 lb (156.5 kg)  06/30/21 (!) 340 lb 9.6 oz (154.5 kg)  04/03/21 (!) 350 lb (158.8 kg)    There are no preventive care reminders to display for this patient.  There are no preventive care reminders to display for this patient.   Lab Results  Component Value Date   TSH 0.928 04/01/2020   Lab Results  Component Value Date   WBC 6.3 04/01/2020   HGB 14.0 04/01/2020   HCT 41.7 04/01/2020   MCV 89 04/01/2020   PLT 293 04/01/2020   Lab Results  Component Value Date   NA 137 04/01/2020   K 4.5 04/01/2020   CO2 23 04/01/2020   GLUCOSE 99 04/01/2020   BUN 8 04/01/2020   CREATININE 0.99 04/01/2020   BILITOT 0.4 04/01/2020   ALKPHOS 103 04/01/2020   AST 15 04/01/2020   ALT 17 04/01/2020   PROT 7.2 04/01/2020   ALBUMIN 4.5 04/01/2020   CALCIUM 9.4 04/01/2020   ANIONGAP 7 10/06/2018   Lab Results  Component Value Date   CHOL 152 10/10/2020   Lab Results  Component Value Date   HDL 40 10/10/2020   Lab Results  Component Value Date   LDLCALC 80 10/10/2020   Lab Results  Component Value Date   TRIG 191 (H) 10/10/2020   Lab Results  Component Value Date   CHOLHDL 3.8 10/10/2020   Lab Results  Component Value Date   HGBA1C 5.6 04/01/2020       Assessment & Plan:   Problem List Items Addressed This Visit       Other   High risk sexual behavior   Relevant Medications   emtricitabine-tenofovir (TRUVADA) 200-300 MG tablet   Other Relevant Orders   RPR+HIV+GC+CT Panel   Elevated liver enzymes   Relevant Orders   CBC with  Differential/Platelet   Prediabetes - Primary   Relevant Orders  CBC with Differential/Platelet   Comprehensive metabolic panel   Hemoglobin A1c   Mixed hyperlipidemia   Relevant Medications   atorvastatin (LIPITOR) 10 MG tablet   Other Relevant Orders   CBC with Differential/Platelet   Lipid panel   Contact with or exposure to communicable disease   Relevant Medications   emtricitabine-tenofovir (TRUVADA) 200-300 MG tablet   Other Relevant Orders   RPR+HIV+GC+CT Panel     Meds ordered this encounter  Medications   atorvastatin (LIPITOR) 10 MG tablet    Sig: Take 1 tablet (10 mg total) by mouth daily.    Dispense:  90 tablet    Refill:  2    Order Specific Question:   Supervising Provider    Answer:   Denita Lung [[id number]]   emtricitabine-tenofovir (TRUVADA) 200-300 MG tablet    Sig: Take 1 tablet by mouth daily    Dispense:  90 tablet    Refill:  0    Order Specific Question:   Supervising Provider    Answer:   Denita Lung [[id number]]   Return in 3 months for routine follow up appointment.  Return in 6 months for annual exam.  Irene Pap, PA-C

## 2021-10-11 LAB — CBC WITH DIFFERENTIAL/PLATELET
Basophils Absolute: 0 10*3/uL (ref 0.0–0.2)
Basos: 1 %
EOS (ABSOLUTE): 0.2 10*3/uL (ref 0.0–0.4)
Eos: 3 %
Hematocrit: 41 % (ref 37.5–51.0)
Hemoglobin: 14.4 g/dL (ref 13.0–17.7)
Immature Grans (Abs): 0 10*3/uL (ref 0.0–0.1)
Immature Granulocytes: 0 %
Lymphocytes Absolute: 2 10*3/uL (ref 0.7–3.1)
Lymphs: 32 %
MCH: 30.4 pg (ref 26.6–33.0)
MCHC: 35.1 g/dL (ref 31.5–35.7)
MCV: 87 fL (ref 79–97)
Monocytes Absolute: 0.3 10*3/uL (ref 0.1–0.9)
Monocytes: 5 %
Neutrophils Absolute: 3.7 10*3/uL (ref 1.4–7.0)
Neutrophils: 59 %
Platelets: 315 10*3/uL (ref 150–450)
RBC: 4.74 x10E6/uL (ref 4.14–5.80)
RDW: 13 % (ref 11.6–15.4)
WBC: 6.2 10*3/uL (ref 3.4–10.8)

## 2021-10-11 LAB — COMPREHENSIVE METABOLIC PANEL
ALT: 14 IU/L (ref 0–44)
AST: 13 IU/L (ref 0–40)
Albumin/Globulin Ratio: 1.9 (ref 1.2–2.2)
Albumin: 4.8 g/dL (ref 4.0–5.0)
Alkaline Phosphatase: 112 IU/L (ref 44–121)
BUN/Creatinine Ratio: 8 — ABNORMAL LOW (ref 9–20)
BUN: 8 mg/dL (ref 6–24)
Bilirubin Total: 0.3 mg/dL (ref 0.0–1.2)
CO2: 26 mmol/L (ref 20–29)
Calcium: 9.9 mg/dL (ref 8.7–10.2)
Chloride: 102 mmol/L (ref 96–106)
Creatinine, Ser: 1.06 mg/dL (ref 0.76–1.27)
Globulin, Total: 2.5 g/dL (ref 1.5–4.5)
Glucose: 101 mg/dL — ABNORMAL HIGH (ref 70–99)
Potassium: 4.8 mmol/L (ref 3.5–5.2)
Sodium: 145 mmol/L — ABNORMAL HIGH (ref 134–144)
Total Protein: 7.3 g/dL (ref 6.0–8.5)
eGFR: 89 mL/min/{1.73_m2} (ref >59)

## 2021-10-11 LAB — RPR+HIV+GC+CT PANEL
Chlamydia trachomatis, NAA: NEGATIVE
HIV Screen 4th Generation wRfx: NONREACTIVE
Neisseria Gonorrhoeae by PCR: NEGATIVE
RPR Ser Ql: NONREACTIVE

## 2021-10-11 LAB — HEMOGLOBIN A1C
Est. average glucose Bld gHb Est-mCnc: 120 mg/dL
Hgb A1c MFr Bld: 5.8 % — ABNORMAL HIGH (ref 4.8–5.6)

## 2021-10-11 LAB — LIPID PANEL
Chol/HDL Ratio: 3.5 ratio (ref 0.0–5.0)
Cholesterol, Total: 147 mg/dL (ref 100–199)
HDL: 42 mg/dL (ref >39)
LDL Chol Calc (NIH): 80 mg/dL (ref 0–99)
Triglycerides: 143 mg/dL (ref 0–149)
VLDL Cholesterol Cal: 25 mg/dL (ref 5–40)

## 2021-12-19 ENCOUNTER — Other Ambulatory Visit: Payer: Self-pay | Admitting: Physician Assistant

## 2021-12-19 DIAGNOSIS — Z209 Contact with and (suspected) exposure to unspecified communicable disease: Secondary | ICD-10-CM

## 2021-12-19 DIAGNOSIS — Z7251 High risk heterosexual behavior: Secondary | ICD-10-CM

## 2021-12-19 NOTE — Telephone Encounter (Signed)
Is this ok to refill. Pt has follow up with you on 5/22 ?

## 2022-01-08 ENCOUNTER — Encounter: Payer: Self-pay | Admitting: Physician Assistant

## 2022-01-08 ENCOUNTER — Ambulatory Visit: Payer: BC Managed Care – PPO | Admitting: Physician Assistant

## 2022-01-08 VITALS — BP 130/80 | HR 68 | Ht 74.0 in | Wt 345.4 lb

## 2022-01-08 DIAGNOSIS — Z6841 Body Mass Index (BMI) 40.0 and over, adult: Secondary | ICD-10-CM

## 2022-01-08 DIAGNOSIS — Z209 Contact with and (suspected) exposure to unspecified communicable disease: Secondary | ICD-10-CM | POA: Diagnosis not present

## 2022-01-08 DIAGNOSIS — K648 Other hemorrhoids: Secondary | ICD-10-CM | POA: Diagnosis not present

## 2022-01-08 DIAGNOSIS — E782 Mixed hyperlipidemia: Secondary | ICD-10-CM

## 2022-01-08 DIAGNOSIS — Z7251 High risk heterosexual behavior: Secondary | ICD-10-CM

## 2022-01-08 MED ORDER — ATORVASTATIN CALCIUM 10 MG PO TABS: 10.0000 mg | ORAL_TABLET | Freq: Every day | ORAL | 2 refills | Status: DC

## 2022-01-08 MED ORDER — EMTRICITABINE-TENOFOVIR DF 200-300 MG PO TABS: ORAL_TABLET | ORAL | 1 refills | Status: DC

## 2022-01-08 NOTE — Progress Notes (Signed)
Established Patient Office Visit  Subjective:  Patient ID: Andrew Harrington, male    DOB: 10-04-1975  Age: 46 y.o. MRN: [id number]  CC:  Chief Complaint  Patient presents with   Medication Refill    Medcheck- wants to discuss hemorrhoids     HPI Andrew Harrington presents for a follow up appointment; is tolerating medicines well; has had external hemorrhoids for years that do not bleed, uses OTC preparation H for them, but is concerned because they have been there for a while; denies constipation; also asks about something to help with weight loss due to his busy schedule; reports that he is trying to eat a healthy diet, drink water, and exercise.   Outpatient Medications Prior to Visit  Medication Sig Dispense Refill   Multiple Vitamin (MULTIVITAMIN) tablet Take 1 tablet by mouth daily.     Omega-3 Fatty Acids (FISH OIL) 1000 MG CAPS Take 1 capsule by mouth daily.     atorvastatin (LIPITOR) 10 MG tablet Take 1 tablet (10 mg total) by mouth daily. 90 tablet 2   emtricitabine-tenofovir (TRUVADA) 200-300 MG tablet TAKE 1 TABLET BY MOUTH 1 TIME A DAY 90 tablet 0   No facility-administered medications prior to visit.    No Known Allergies  Patient Care Team: Marcellina Millin as PCP - General (Physician Assistant)  ROS Review of Systems  Constitutional:  Negative for activity change, chills, fatigue and fever.  HENT:  Negative for congestion, ear pain, hearing loss and voice change.   Eyes:  Negative for pain and redness.  Respiratory:  Negative for cough and shortness of breath.   Cardiovascular:  Negative for leg swelling.  Gastrointestinal:  Negative for constipation, diarrhea, nausea and vomiting.  Endocrine: Negative for polyuria.  Genitourinary:  Negative for flank pain and frequency.  Musculoskeletal:  Negative for joint swelling and neck pain.  Skin:  Negative for rash.  Neurological:  Negative for dizziness.  Hematological:  Does not bruise/bleed easily.   Psychiatric/Behavioral:  Negative for agitation and behavioral problems.      Objective:    Physical Exam Vitals reviewed.  Constitutional:      General: He is not in acute distress.    Appearance: Normal appearance.  HENT:     Head: Normocephalic and atraumatic.     Right Ear: External ear normal.     Left Ear: External ear normal.     Nose: No congestion.  Eyes:     Extraocular Movements: Extraocular movements intact.     Conjunctiva/sclera: Conjunctivae normal.     Pupils: Pupils are equal, round, and reactive to light.  Cardiovascular:     Rate and Rhythm: Normal rate and regular rhythm.     Pulses: Normal pulses.     Heart sounds: Normal heart sounds.  Pulmonary:     Effort: Pulmonary effort is normal.     Breath sounds: Normal breath sounds. No wheezing.  Abdominal:     General: Bowel sounds are normal.     Palpations: Abdomen is soft.  Musculoskeletal:        General: Normal range of motion.     Cervical back: Normal range of motion.     Right lower leg: No edema.     Left lower leg: No edema.  Skin:    General: Skin is warm and dry.  Neurological:     Mental Status: He is alert and oriented to person, place, and time.  Psychiatric:  Mood and Affect: Mood normal.        Behavior: Behavior normal.    BP 130/80   Pulse 68   Ht _0  (1.88 m)   Wt (!) 345 lb 6.4 oz (156.7 kg)   SpO2 97%   BMI 44.35 kg/m   Wt Readings from Last 3 Encounters:  01/08/22 (!) 345 lb 6.4 oz (156.7 kg)  10/09/21 (!) 345 lb (156.5 kg)  06/30/21 (!) 340 lb 9.6 oz (154.5 kg)    Results for orders placed or performed in visit on 10/09/21  CBC with Differential/Platelet  Result Value Ref Range   WBC 6.2 3.4 - 10.8 x10E3/uL   RBC 4.74 4.14 - 5.80 x10E6/uL   Hemoglobin 14.4 13.0 - 17.7 g/dL   Hematocrit 41.0 37.5 - 51.0 %   MCV 87 79 - 97 fL   MCH 30.4 26.6 - 33.0 pg   MCHC 35.1 31.5 - 35.7 g/dL   RDW 13.0 11.6 - 15.4 %   Platelets 315 150 - 450 x10E3/uL    Neutrophils 59 Not Estab. %   Lymphs 32 Not Estab. %   Monocytes 5 Not Estab. %   Eos 3 Not Estab. %   Basos 1 Not Estab. %   Neutrophils Absolute 3.7 1.4 - 7.0 x10E3/uL   Lymphocytes Absolute 2.0 0.7 - 3.1 x10E3/uL   Monocytes Absolute 0.3 0.1 - 0.9 x10E3/uL   EOS (ABSOLUTE) 0.2 0.0 - 0.4 x10E3/uL   Basophils Absolute 0.0 0.0 - 0.2 x10E3/uL   Immature Granulocytes 0 Not Estab. %   Immature Grans (Abs) 0.0 0.0 - 0.1 x10E3/uL  Comprehensive metabolic panel  Result Value Ref Range   Glucose 101 (H) 70 - 99 mg/dL   BUN 8 6 - 24 mg/dL   Creatinine, Ser 1.06 0.76 - 1.27 mg/dL   eGFR 89 >59 mL/min/1.73   BUN/Creatinine Ratio 8 (L) 9 - 20   Sodium 145 (H) 134 - 144 mmol/L   Potassium 4.8 3.5 - 5.2 mmol/L   Chloride 102 96 - 106 mmol/L   CO2 26 20 - 29 mmol/L   Calcium 9.9 8.7 - 10.2 mg/dL   Total Protein 7.3 6.0 - 8.5 g/dL   Albumin 4.8 4.0 - 5.0 g/dL   Globulin, Total 2.5 1.5 - 4.5 g/dL   Albumin/Globulin Ratio 1.9 1.2 - 2.2   Bilirubin Total 0.3 0.0 - 1.2 mg/dL   Alkaline Phosphatase 112 44 - 121 IU/L   AST 13 0 - 40 IU/L   ALT 14 0 - 44 IU/L  Lipid panel  Result Value Ref Range   Cholesterol, Total 147 100 - 199 mg/dL   Triglycerides 143 0 - 149 mg/dL   HDL 42 >39 mg/dL   VLDL Cholesterol Cal 25 5 - 40 mg/dL   LDL Chol Calc (NIH) 80 0 - 99 mg/dL   Chol/HDL Ratio 3.5 0.0 - 5.0 ratio  Hemoglobin A1c  Result Value Ref Range   Hgb A1c MFr Bld 5.8 (H) 4.8 - 5.6 %   Est. average glucose Bld gHb Est-mCnc 120 mg/dL  RPR+HIV+GC+CT Panel  Result Value Ref Range   RPR Ser Ql Non Reactive Non Reactive   HIV Screen 4th Generation wRfx Non Reactive Non Reactive   Chlamydia trachomatis, NAA Negative Negative   Neisseria Gonorrhoeae by PCR Negative Negative       The 10-year ASCVD risk score (Arnett DK, et al., 2019) is: 1.4%    Assessment & Plan:   Problem List Items Addressed This  Visit       Other   Obesity   Relevant Orders   Amb Ref to Medical Weight Management    High risk sexual behavior   Relevant Medications   emtricitabine-tenofovir (TRUVADA) 200-300 MG tablet   Other Relevant Orders   RPR+HIV+GC+CT Panel   Mixed hyperlipidemia - Primary   Relevant Medications   atorvastatin (LIPITOR) 10 MG tablet   Contact with or exposure to communicable disease   Relevant Medications   emtricitabine-tenofovir (TRUVADA) 200-300 MG tablet   Other Relevant Orders   RPR+HIV+GC+CT Panel   Other Visit Diagnoses     Internal hemorrhoid       Relevant Medications   atorvastatin (LIPITOR) 10 MG tablet   Other Relevant Orders   Ambulatory referral to General Surgery       Meds ordered this encounter  Medications   atorvastatin (LIPITOR) 10 MG tablet    Sig: Take 1 tablet (10 mg total) by mouth daily.    Dispense:  90 tablet    Refill:  2    Order Specific Question:   Supervising Provider    Answer:   Denita Lung [[id number]]   emtricitabine-tenofovir (TRUVADA) 200-300 MG tablet    Sig: TAKE 1 TABLET BY MOUTH 1 TIME A DAY    Dispense:  90 tablet    Refill:  1    Order Specific Question:   Supervising Provider    Answer:   Denita Lung [[id number]]    Follow-up: Return for Return as Already Scheduled.    Irene Pap, PA-C

## 2022-01-08 NOTE — Patient Instructions (Signed)
You will get a call to schedule an appointment with General Surgery and [facility]

## 2022-01-10 LAB — RPR+HIV+GC+CT PANEL
Chlamydia trachomatis, NAA: NEGATIVE
HIV Screen 4th Generation wRfx: NONREACTIVE
Neisseria Gonorrhoeae by PCR: NEGATIVE
RPR Ser Ql: NONREACTIVE

## 2022-02-16 ENCOUNTER — Encounter: Payer: Self-pay | Admitting: Internal Medicine

## 2022-03-15 ENCOUNTER — Telehealth: Payer: Self-pay | Admitting: Physician Assistant

## 2022-03-15 NOTE — Telephone Encounter (Signed)
General Surgery Referral

## 2022-03-20 HISTORY — PX: NO PAST SURGERIES: SHX2092

## 2022-04-02 ENCOUNTER — Ambulatory Visit (INDEPENDENT_AMBULATORY_CARE_PROVIDER_SITE_OTHER): Payer: BC Managed Care – PPO | Admitting: Medical

## 2022-04-02 ENCOUNTER — Encounter: Payer: Self-pay | Admitting: Medical

## 2022-04-02 VITALS — BP 110/70 | HR 83 | Ht 74.0 in | Wt 347.6 lb

## 2022-04-02 DIAGNOSIS — R7303 Prediabetes: Secondary | ICD-10-CM

## 2022-04-02 DIAGNOSIS — Z0000 Encounter for general adult medical examination without abnormal findings: Secondary | ICD-10-CM | POA: Diagnosis not present

## 2022-04-02 DIAGNOSIS — Z79899 Other long term (current) drug therapy: Secondary | ICD-10-CM

## 2022-04-02 DIAGNOSIS — Z299 Encounter for prophylactic measures, unspecified: Secondary | ICD-10-CM

## 2022-04-02 DIAGNOSIS — Z113 Encounter for screening for infections with a predominantly sexual mode of transmission: Secondary | ICD-10-CM

## 2022-04-02 DIAGNOSIS — E782 Mixed hyperlipidemia: Secondary | ICD-10-CM

## 2022-04-02 DIAGNOSIS — Z7251 High risk heterosexual behavior: Secondary | ICD-10-CM | POA: Diagnosis not present

## 2022-04-02 DIAGNOSIS — Z6841 Body Mass Index (BMI) 40.0 and over, adult: Secondary | ICD-10-CM

## 2022-04-02 NOTE — Progress Notes (Unsigned)
Subjective:   HPI  Andrew Harrington is a 46 y.o. male who presents for Chief Complaint  Patient presents with   fasting cpe    Fasting cpe, no concerns    Patient Care Team: Lottie Siska, Leward Quan as PCP - General (Family Medicine) Sees dentist Sees eye doctor  Concerns: Here for well visit.  No recent concerns or problems.  He is compliant with Truvada prep and condoms.  Sexual preference men.  No recent symptoms, no new partners in the last year   Reviewed their medical, surgical, family, social, medication, and allergy history and updated chart as appropriate.  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  repaired   Other Father        pneumonia   Hypertension Maternal Grandmother    Alzheimer's disease Paternal Grandmother    Cancer Neg Hx      Current Outpatient Medications:    atorvastatin (LIPITOR) 10 MG tablet, Take 1 tablet (10 mg total) by mouth daily., Disp: 90 tablet, Rfl: 2   emtricitabine-tenofovir (TRUVADA) 200-300 MG tablet, TAKE 1 TABLET BY MOUTH 1 TIME A DAY, Disp: 90 tablet, Rfl: 1   Multiple Vitamin (MULTIVITAMIN) tablet, Take 1 tablet by mouth daily., Disp: , Rfl:    Omega-3 Fatty Acids (FISH OIL) 1000 MG CAPS, Take 1 capsule by mouth daily., Disp: , Rfl:   No Known Allergies   Review of Systems Constitutional: -fever, -chills, -sweats, -unexpected weight change, -decreased appetite, -fatigue Allergy: -sneezing, -itching, -congestion Dermatology: -changing moles, --rash, -lumps ENT: -runny nose, -ear pain, -sore throat, -hoarseness, -sinus pain, -teeth pain, - ringing in ears, -hearing loss, -nosebleeds Cardiology: -chest pain, -palpitations, -swelling, -difficulty breathing when lying flat,  -waking up short of breath Respiratory: -cough, -shortness of breath, -difficulty breathing with exercise or exertion, -wheezing, -coughing up blood Gastroenterology: -abdominal pain, -nausea, -vomiting, -diarrhea, -constipation, -blood in stool, -changes in bowel movement, -difficulty swallowing or eating Hematology: -bleeding, -bruising  Musculoskeletal: -joint aches, -muscle aches, -joint swelling, -back pain, -neck pain, -cramping, -changes in gait Ophthalmology: denies vision changes, eye redness, itching, discharge Urology: -burning with urination, -difficulty urinating, -blood in urine, -urinary frequency, -urgency, -incontinence Neurology: -headache, -weakness, -tingling, -numbness, -memory loss, -falls, -dizziness Psychology: -depressed mood, -agitation, -sleep problems Male GU: no testicular mass, pain, no lymph nodes swollen, no swelling, no rash.     04/02/2022    3:25 PM 01/08/2022    8:17 AM 04/01/2020    9:31 AM 10/05/2019   11:10 AM 03/30/2019    9:10 AM  Depression screen PHQ 2/9  Decreased Interest 0 0 0 0 0  Down, Depressed, Hopeless 0 0 0 0 0  PHQ - 2 Score 0 0 0 0 0        Objective:  BP 110/70   Pulse 83   Ht $R'6\' 2"'go$  (1.88 m)   Wt (!) 347 lb 9.6 oz (157.7 kg)   BMI 44.63 kg/m   General appearance: alert, no distress, WD/WN, Caucasian male Skin: Scattered macules, no worrisome lesions HEENT: normocephalic, conjunctiva/corneas normal, sclerae anicteric, PERRLA, EOMi, nares patent, no discharge or erythema, pharynx normal Oral cavity: MMM, tongue normal, teeth normal Neck: supple, no lymphadenopathy,  no thyromegaly, no masses, normal ROM, no bruits Chest: non tender, normal shape and expansion Heart: RRR, normal S1, S2, no murmurs Lungs: CTA bilaterally, no wheezes, rhonchi, or rales Abdomen: +bs, soft, non tender, non distended, no masses, no hepatomegaly, no splenomegaly, no bruits Back: non tender, normal ROM, no scoliosis Musculoskeletal: upper extremities  non tender, no obvious deformity, normal ROM throughout, lower extremities non tender, no obvious deformity, normal ROM throughout Extremities: no edema, no cyanosis, no clubbing Pulses: 2+ symmetric, upper and lower extremities, normal cap refill Neurological: alert, oriented x 3, CN2-12 intact, strength normal upper extremities and lower extremities, sensation normal throughout, DTRs 2+ throughout, no cerebellar signs, gait normal Psychiatric: normal affect, behavior normal, pleasant  GU: normal male external genitalia, circumscribed, there is a glans penis piercing as well as a scrotal ring in place, nontender, no masses, no hernia, no lymphadenopathy Rectal: Deferred   Assessment and Plan :   Encounter Diagnoses  Name Primary?   Encounter for health maintenance examination in adult Yes   Prediabetes    Class 3 severe obesity with body mass index (BMI) of 40.0 to 44.9 in adult, unspecified obesity type, unspecified whether serious comorbidity present ([place])    High risk sexual behavior, unspecified type    Mixed hyperlipidemia    Prophylactic measure    Screen for STD (sexually transmitted disease)    High risk medication use     This visit was a preventative care visit, also known as wellness visit or routine physical.   Topics typically include healthy lifestyle, diet, exercise, preventative care, vaccinations, sick and well care, proper use of emergency dept and after hours care, as well as other concerns.     Recommendations: Continue to return yearly for your annual wellness and preventative care visits.  This gives [place] a chance to discuss healthy lifestyle, exercise, vaccinations, review your chart record, and perform screenings where appropriate.  I recommend you see your eye doctor yearly for routine vision care.  I recommend you see your dentist yearly for routine dental care including hygiene visits twice yearly.   Vaccination recommendations were reviewed Immunization  History  Administered Date(s) Administered   DTaP 11/02/1977, 01/04/1978, 03/06/1978, 04/09/1979, 12/01/1982   Hepatitis B, adult 09/19/2016, 10/22/2016, 04/23/2017   IPV 01/04/1978, 03/06/1978, 05/10/1978, 04/09/1979, 12/01/1982   Influenza,inj,Quad PF,6+ Mos 05/17/2019   Influenza-Unspecified 06/24/2017, 06/24/2018, 05/17/2019, 05/31/2021   MMR 12/11/1978, 01/22/1996   Moderna Covid-19 Vaccine Bivalent Booster 25yrs & up 05/31/2021   Moderna Sars-Covid-2 Vaccination 09/21/2019, 10/19/2019, 07/11/2020   Td 01/22/1996, 08/20/2000   Tdap 04/04/2016     Screening for cancer: Colon cancer screening: Age 59  Testicular cancer screening You should do a monthly self testicular exam if you are between 53-67 years old  We discussed PSA, prostate exam, and prostate cancer screening risks/benefits.     Skin cancer screening: Check your skin regularly for new changes, growing lesions, or other lesions of concern Come in for evaluation if you have skin lesions of concern.  Lung cancer screening: If you have a greater than 20 pack year history of tobacco use, then you may qualify for lung cancer screening with a chest CT scan.   Please call your insurance company to inquire about coverage for this test.  We currently don't have screenings for other cancers besides breast, cervical, colon, and lung cancers.  If you have a strong family history of cancer or have other cancer screening concerns, please let me know.    Bone health: Get  at least 150 minutes of aerobic exercise weekly Get weight bearing exercise at least once weekly Bone density test:  A bone density test is an imaging test that uses a type of X-ray to measure the amount of calcium and other minerals in your bones. The test may be used to diagnose or screen you for a condition that causes weak or thin bones (osteoporosis), predict your risk for a broken bone (fracture), or determine how well your osteoporosis treatment is  working. The bone density test is recommended for females 36 and older, or females or males <73 if certain risk factors such as thyroid disease, long term use of steroids such as for asthma or rheumatological issues, vitamin D deficiency, estrogen deficiency, family history of osteoporosis, self or family history of fragility fracture in first degree relative.    Heart health: Get at least 150 minutes of aerobic exercise weekly Limit alcohol It is important to maintain a healthy blood pressure and healthy cholesterol numbers  Heart disease screening: Screening for heart disease includes screening for blood pressure, fasting lipids, glucose/diabetes screening, BMI height to weight ratio, reviewed of smoking status, physical activity, and diet.    Goals include blood pressure 120/80 or less, maintaining a healthy lipid/cholesterol profile, preventing diabetes or keeping diabetes numbers under good control, not smoking or using tobacco products, exercising most days per week or at least 150 minutes per week of exercise, and eating healthy variety of fruits and vegetables, healthy oils, and avoiding unhealthy food choices like fried food, fast food, high sugar and high cholesterol foods.    Other tests may possibly include EKG test, CT coronary calcium score, echocardiogram, exercise treadmill stress test.   We discussed possibly doing CT coronary cardiac score.  He will consider    Medical care options: I recommend you continue to seek care here first for routine care.  We try really hard to have available appointments Monday through Friday daytime hours for sick visits, acute visits, and physicals.  Urgent care should be used for after hours and weekends for significant issues that cannot wait till the next day.  The emergency department should be used for significant potentially life-threatening emergencies.  The emergency department is expensive, can often have long wait times for less  significant concerns, so try to utilize primary care, urgent care, or telemedicine when possible to avoid unnecessary trips to the emergency department.  Virtual visits and telemedicine have been introduced since the pandemic started in 2020, and can be convenient ways to receive medical care.  We offer virtual appointments as well to assist you in a variety of options to seek medical care.   Advanced Directives: I recommend you consider completing a [place] and Living Will.   These documents respect your wishes and help alleviate burdens on your loved ones if you were to become terminally ill or be in a position to need those documents enforced.    You can complete Advanced Directives yourself, have them notarized, then have copies made for our office, for you and for anybody you feel should have them in safe keeping.  Or, you can have an attorney prepare these documents.   If you haven't updated your Last Will and Testament in a while, it may be worthwhile having an attorney prepare these documents together and save on some costs.       Separate significant issues discussed: Prophylactic measure, screen for STD-continue on Truvada, continue routine testing  Obesity-work on efforts to  lose weight through healthy diet and exercise  Hyperlipidemia-compliant with statin   Jarrah was seen today for fasting cpe.  Diagnoses and all orders for this visit:  Encounter for health maintenance examination in adult -     Lipid panel -     Hemoglobin A1c -     RPR+HIV+GC+CT Panel -     Comprehensive metabolic panel -     CBC -     VITAMIN D 25 Hydroxy (Vit-D Deficiency, Fractures) -     POCT Urinalysis DIP (Proadvantage Device)  Prediabetes -     Hemoglobin A1c  Class 3 severe obesity with body mass index (BMI) of 40.0 to 44.9 in adult, unspecified obesity type, unspecified whether serious comorbidity present ([facility])  High risk sexual behavior, unspecified type -      RPR+HIV+GC+CT Panel  Mixed hyperlipidemia -     Lipid panel  Prophylactic measure -     RPR+HIV+GC+CT Panel  Screen for STD (sexually transmitted disease) -     RPR+HIV+GC+CT Panel  High risk medication use -     VITAMIN D 25 Hydroxy (Vit-D Deficiency, Fractures) -     POCT Urinalysis DIP (Proadvantage Device)    Follow-up pending labs, yearly for physical

## 2022-04-03 ENCOUNTER — Other Ambulatory Visit: Payer: Self-pay | Admitting: Medical

## 2022-04-03 LAB — COMPREHENSIVE METABOLIC PANEL
ALT: 20 IU/L (ref 0–44)
AST: 15 IU/L (ref 0–40)
Albumin/Globulin Ratio: 1.6 (ref 1.2–2.2)
Albumin: 4.8 g/dL (ref 4.1–5.1)
Alkaline Phosphatase: 118 IU/L (ref 44–121)
BUN/Creatinine Ratio: 8 — ABNORMAL LOW (ref 9–20)
BUN: 8 mg/dL (ref 6–24)
Bilirubin Total: 0.5 mg/dL (ref 0.0–1.2)
CO2: 22 mmol/L (ref 20–29)
Calcium: 9.5 mg/dL (ref 8.7–10.2)
Chloride: 101 mmol/L (ref 96–106)
Creatinine, Ser: 1.06 mg/dL (ref 0.76–1.27)
Globulin, Total: 3 g/dL (ref 1.5–4.5)
Glucose: 91 mg/dL (ref 70–99)
Potassium: 4.6 mmol/L (ref 3.5–5.2)
Sodium: 140 mmol/L (ref 134–144)
Total Protein: 7.8 g/dL (ref 6.0–8.5)
eGFR: 89 mL/min/{1.73_m2} (ref >59)

## 2022-04-03 LAB — POCT URINALYSIS DIP (PROADVANTAGE DEVICE)
Bilirubin, UA: NEGATIVE
Blood, UA: NEGATIVE
Glucose, UA: NEGATIVE mg/dL
Ketones, POC UA: NEGATIVE mg/dL
Leukocytes, UA: NEGATIVE
Nitrite, UA: NEGATIVE
Protein Ur, POC: NEGATIVE mg/dL
Specific Gravity, Urine: 1.025
Urobilinogen, Ur: NEGATIVE
pH, UA: 6 (ref 5.0–8.0)

## 2022-04-03 LAB — CBC
Hematocrit: 44.4 % (ref 37.5–51.0)
Hemoglobin: 14.7 g/dL (ref 13.0–17.7)
MCH: 29.6 pg (ref 26.6–33.0)
MCHC: 33.1 g/dL (ref 31.5–35.7)
MCV: 90 fL (ref 79–97)
Platelets: 331 10*3/uL (ref 150–450)
RBC: 4.96 x10E6/uL (ref 4.14–5.80)
RDW: 13.1 % (ref 11.6–15.4)
WBC: 8.6 10*3/uL (ref 3.4–10.8)

## 2022-04-03 LAB — RPR+HIV+GC+CT PANEL
Chlamydia trachomatis, NAA: NEGATIVE
HIV Screen 4th Generation wRfx: NONREACTIVE
Neisseria Gonorrhoeae by PCR: NEGATIVE
RPR Ser Ql: NONREACTIVE

## 2022-04-03 LAB — LIPID PANEL
Chol/HDL Ratio: 4.3 ratio (ref 0.0–5.0)
Cholesterol, Total: 166 mg/dL (ref 100–199)
HDL: 39 mg/dL — ABNORMAL LOW (ref >39)
LDL Chol Calc (NIH): 81 mg/dL (ref 0–99)
Triglycerides: 278 mg/dL — ABNORMAL HIGH (ref 0–149)
VLDL Cholesterol Cal: 46 mg/dL — ABNORMAL HIGH (ref 5–40)

## 2022-04-03 LAB — HEMOGLOBIN A1C
Est. average glucose Bld gHb Est-mCnc: 117 mg/dL
Hgb A1c MFr Bld: 5.7 % — ABNORMAL HIGH (ref 4.8–5.6)

## 2022-04-03 LAB — VITAMIN D 25 HYDROXY (VIT D DEFICIENCY, FRACTURES): Vit D, 25-Hydroxy: 28.1 ng/mL — ABNORMAL LOW (ref 30.0–100.0)

## 2022-04-03 MED ORDER — ATORVASTATIN CALCIUM 10 MG PO TABS: 10.0000 mg | ORAL_TABLET | Freq: Every day | ORAL | 3 refills | Status: DC

## 2022-04-03 MED ORDER — VITAMIN D 50 MCG (2000 UT) PO CAPS: 1.0000 | ORAL_CAPSULE | Freq: Every day | ORAL | 3 refills | Status: DC

## 2022-04-03 MED ORDER — FENOFIBRATE 145 MG PO TABS: 145.0000 mg | ORAL_TABLET | Freq: Every day | ORAL | 1 refills | Status: DC

## 2022-04-16 ENCOUNTER — Encounter: Payer: Self-pay | Admitting: Medical

## 2022-04-16 ENCOUNTER — Ambulatory Visit: Payer: BC Managed Care – PPO | Admitting: Medical

## 2022-04-16 VITALS — BP 132/84 | HR 98 | Temp 98.6°F | Wt 350.6 lb

## 2022-04-16 DIAGNOSIS — T7840XA Allergy, unspecified, initial encounter: Secondary | ICD-10-CM | POA: Diagnosis not present

## 2022-04-16 DIAGNOSIS — R21 Rash and other nonspecific skin eruption: Secondary | ICD-10-CM | POA: Diagnosis not present

## 2022-04-16 MED ORDER — PREDNISONE 10 MG PO TABS: ORAL_TABLET | ORAL | 0 refills | Status: DC

## 2022-04-16 MED ORDER — METHYLPREDNISOLONE ACETATE 80 MG/ML IJ SUSP
80.0000 mg | Freq: Once | INTRAMUSCULAR | Status: AC
  Administered 2022-04-16: 80 mg via INTRAMUSCULAR

## 2022-04-16 MED ORDER — TRIAMCINOLONE ACETONIDE 0.1 % EX CREA: 1.0000 | TOPICAL_CREAM | Freq: Two times a day (BID) | CUTANEOUS | 0 refills | Status: DC

## 2022-04-16 NOTE — Progress Notes (Signed)
Subjective:  Andrew Harrington is a 46 y.o. male who presents for Chief Complaint  Patient presents with   other    Skin rash, rash on arms and legs in another teachers classroom for 4 hrs Saturday saw some black mold in there. Starting to itch bad now, has taken benadryl Saturday and Sunday. Also tried allegry for hive and and anti itch spray.      Here for rash.  Started 3 days ago.  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  He noticed there was mold on the ceiling, mold on chairs and notes a definite musty mold smell in the room.  After being in there a few hours he started seeing some redness to his skin and started getting itchy.  Later that day he developed a rash on his arms and legs that has gradually gotten worse over the last few days.  Now is obvious itchy irritated rash all over his lower legs, arms and a little bit on his neck and head.  Basically anywhere that was exposed without clothing.  He had a shirt and shorts on and his torso and upper legs and buttocks have no rash.  He thinks the mold triggered this rash.  He has a history of rash to poison ivy but no other known allergies  He used some Benadryl over the weekend but that did not seem to help.  No fever, no body aches, no chills,   No other aggravating or relieving factors.    No other c/o.  The following portions of the patient's history were reviewed and updated as appropriate: allergies, current medications, past family history, past medical history, past social history, past surgical history and problem list.  ROS Otherwise as in subjective above  Objective: BP 132/84   Pulse 98   Temp 98.6 F (37 C)   Wt (!) 350 lb 9.6 oz (159 kg)   BMI 45.01 kg/m   General appearance: alert, no distress, well developed, well nourished Nontender to extremities He has a diffuse maculopapular rash on bilateral arms throughout including the  hands, legs below the knees and a little bit on the neck in general.  No obvious rash on torso or areas that were covered by clothing Pulses: 2+ radial pulses, 2+ pedal pulses, normal cap refill Ext: no edema   Assessment: Encounter Diagnoses  Name Primary?   Allergic reaction, initial encounter Yes   Rash      Plan: We discussed possible causes of maculopapular rash which include infection and allergic reaction.  I suspect allergic reaction.  He has no other illness symptoms.  We discussed the potential for even viruses causing similar rash but he has no concern for other illness or new STD exposure  We both agree this is likely an allergic reaction rash.  Very well could have been the mold in the room since that was the only thing different in his routine and there was an obvious smell of mold with his documents  Begin OTC benadryl TID, prednisone below and Dep Medrol '80mg'$  IM injection given today.  We discussed calling or rechecking if rash not seemingly improving in the next 3 to 4 days.  If new symptoms or other changes call back or get re-evaluated.   Consider in a month or when back to baseline having some labs for allergy testing, particularly mold allergy test.  Ysmael was seen today for other.  Diagnoses and all orders for this visit:  Allergic reaction, initial encounter -     methylPREDNISolone acetate (DEPO-MEDROL) injection 80 mg  Rash  Other orders -     predniSONE (DELTASONE) 10 MG tablet; 6 tablets all together day 1, 5 tablets day 2, 4 tablets day 3, 3 tablets day 4, 2 tablets day 5, 1 tablet day 6. -     triamcinolone cream (KENALOG) 0.1 %; Apply 1 Application topically 2 (two) times daily.    Follow up: prn

## 2022-04-25 ENCOUNTER — Encounter: Payer: Self-pay | Admitting: Internal Medicine

## 2022-05-29 ENCOUNTER — Encounter: Payer: Self-pay | Admitting: Internal Medicine

## 2022-06-12 ENCOUNTER — Other Ambulatory Visit: Payer: Self-pay | Admitting: Physician Assistant

## 2022-06-12 DIAGNOSIS — Z209 Contact with and (suspected) exposure to unspecified communicable disease: Secondary | ICD-10-CM

## 2022-06-12 DIAGNOSIS — Z7251 High risk heterosexual behavior: Secondary | ICD-10-CM

## 2022-06-12 NOTE — Telephone Encounter (Signed)
Is this okay to refill? 

## 2022-06-28 ENCOUNTER — Telehealth: Payer: Self-pay | Admitting: Medical

## 2022-06-28 ENCOUNTER — Other Ambulatory Visit: Payer: Self-pay | Admitting: Medical

## 2022-06-28 DIAGNOSIS — Z7251 High risk heterosexual behavior: Secondary | ICD-10-CM

## 2022-06-28 DIAGNOSIS — Z299 Encounter for prophylactic measures, unspecified: Secondary | ICD-10-CM

## 2022-06-28 NOTE — Telephone Encounter (Signed)
Pt is scheduled for labs tomorrow but I dont see any future orders in. What labs will he be getting tomorrow?

## 2022-06-29 ENCOUNTER — Other Ambulatory Visit: Payer: BC Managed Care – PPO

## 2022-06-29 DIAGNOSIS — Z7251 High risk heterosexual behavior: Secondary | ICD-10-CM

## 2022-06-29 DIAGNOSIS — Z299 Encounter for prophylactic measures, unspecified: Secondary | ICD-10-CM

## 2022-06-30 LAB — HIV ANTIBODY (ROUTINE TESTING W REFLEX): HIV Screen 4th Generation wRfx: NONREACTIVE

## 2022-09-28 ENCOUNTER — Other Ambulatory Visit: Payer: Self-pay | Admitting: Medical

## 2022-10-01 ENCOUNTER — Ambulatory Visit: Payer: BC Managed Care – PPO | Admitting: Medical

## 2022-10-01 VITALS — BP 120/80 | HR 64 | Wt 348.4 lb

## 2022-10-01 DIAGNOSIS — Z299 Encounter for prophylactic measures, unspecified: Secondary | ICD-10-CM

## 2022-10-01 DIAGNOSIS — E559 Vitamin D deficiency, unspecified: Secondary | ICD-10-CM | POA: Diagnosis not present

## 2022-10-01 DIAGNOSIS — Z113 Encounter for screening for infections with a predominantly sexual mode of transmission: Secondary | ICD-10-CM

## 2022-10-01 DIAGNOSIS — Z7251 High risk heterosexual behavior: Secondary | ICD-10-CM | POA: Diagnosis not present

## 2022-10-01 DIAGNOSIS — E782 Mixed hyperlipidemia: Secondary | ICD-10-CM | POA: Diagnosis not present

## 2022-10-01 DIAGNOSIS — L989 Disorder of the skin and subcutaneous tissue, unspecified: Secondary | ICD-10-CM

## 2022-10-01 NOTE — Progress Notes (Signed)
Subjective:  Andrew Harrington is a 47 y.o. male who presents for Chief Complaint  Patient presents with   6 month follow-up    6 month follow-up on prep, having some red spots on hands since December     Here for f/u on PreP.  Using truvada prep without side effect or compliant.   Been with same partner for years.  No new symptoms, no new partners.   Here for routine lab  In 03/2022 was low on vitamin D.  He is taking supplement  I saw him for rash 03/2022 that he attributed to mold at school.  Took 2 months for rash to resolve but it finally resolved  He has new recent light brown spots on dorsal hands only.  Not sure about these.  Not itchy.   No other aggravating or relieving factors.    No other c/o.  Past Medical History:  Diagnosis Date   Abnormal SPEP 09/29/2018   M-spike   Hyperlipidemia 04/04/2016   Current Outpatient Medications on File Prior to Visit  Medication Sig Dispense Refill   atorvastatin (LIPITOR) 10 MG tablet Take 1 tablet (10 mg total) by mouth daily. 90 tablet 3   Cholecalciferol (VITAMIN D) 50 MCG (2000 UT) CAPS Take 1 capsule (2,000 Units total) by mouth daily. 90 capsule 3   emtricitabine-tenofovir (TRUVADA) 200-300 MG tablet TAKE 1 TABLET BY MOUTH 1 TIME A DAY. 90 tablet 0   fenofibrate (TRICOR) 145 MG tablet TAKE 1 TABLET BY MOUTH EVERY DAY 90 tablet 1   Multiple Vitamin (MULTIVITAMIN) tablet Take 1 tablet by mouth daily.     triamcinolone cream (KENALOG) 0.1 % Apply 1 Application topically 2 (two) times daily. (Patient not taking: Reported on 10/01/2022) 30 g 0   No current facility-administered medications on file prior to visit.     The following portions of the patient's history were reviewed and updated as appropriate: allergies, current medications, past family history, past medical history, past social history, past surgical history and problem list.  ROS Otherwise as in subjective above  Objective: BP 120/80   Pulse 64   Wt (!) 348 lb 6.4 oz  (158 kg)   BMI 44.73 kg/m   General appearance: alert, no distress, well developed, well nourished Skin: faint 84m or less diameter light brown flat patches/lesions on bilat dorsal hands, nonspecific.  No other obvious rash.  Scattered macules on torso and back     Assessment: Encounter Diagnoses  Name Primary?   Vitamin D deficiency Yes   High risk sexual behavior, unspecified type    Prophylactic measure    Mixed hyperlipidemia    Screen for STD (sexually transmitted disease)    Skin lesion, superficial      Plan: Vitamin D deficiency -compliant with supplement, updated labs today  We discussed PrEP/Truvada medication, continue current therapy, continue routine lab monitoring  Hyperlipidemia-continue atorvastatin and Tricor  Skin concerns-referral to dermatology for skin surveillance  WMeshalwas seen today for 6 month follow-up.  Diagnoses and all orders for this visit:  Vitamin D deficiency -     VITAMIN D 25 Hydroxy (Vit-D Deficiency, Fractures) -     Basic metabolic panel -     Ambulatory referral to Dermatology  High risk sexual behavior, unspecified type -     HIV Antibody (routine testing w rflx)  Prophylactic measure -     HIV Antibody (routine testing w rflx)  Mixed hyperlipidemia  Screen for STD (sexually transmitted disease) -  HIV Antibody (routine testing w rflx)  Skin lesion, superficial -     Ambulatory referral to Dermatology    Follow up: pending labs, referral

## 2022-10-02 ENCOUNTER — Other Ambulatory Visit: Payer: Self-pay | Admitting: Medical

## 2022-10-02 DIAGNOSIS — Z209 Contact with and (suspected) exposure to unspecified communicable disease: Secondary | ICD-10-CM

## 2022-10-02 DIAGNOSIS — Z7251 High risk heterosexual behavior: Secondary | ICD-10-CM

## 2022-10-02 LAB — BASIC METABOLIC PANEL
BUN/Creatinine Ratio: 12 (ref 9–20)
BUN: 13 mg/dL (ref 6–24)
CO2: 23 mmol/L (ref 20–29)
Calcium: 9.4 mg/dL (ref 8.7–10.2)
Chloride: 102 mmol/L (ref 96–106)
Creatinine, Ser: 1.13 mg/dL (ref 0.76–1.27)
Glucose: 90 mg/dL (ref 70–99)
Potassium: 4.3 mmol/L (ref 3.5–5.2)
Sodium: 138 mmol/L (ref 134–144)
eGFR: 82 mL/min/{1.73_m2} (ref >59)

## 2022-10-02 LAB — VITAMIN D 25 HYDROXY (VIT D DEFICIENCY, FRACTURES): Vit D, 25-Hydroxy: 28.9 ng/mL — ABNORMAL LOW (ref 30.0–100.0)

## 2022-10-02 LAB — HIV ANTIBODY (ROUTINE TESTING W REFLEX): HIV Screen 4th Generation wRfx: NONREACTIVE

## 2022-10-02 MED ORDER — EMTRICITABINE-TENOFOVIR DF 200-300 MG PO TABS: 1.0000 | ORAL_TABLET | Freq: Every day | ORAL | 1 refills | Status: DC

## 2022-10-02 NOTE — Progress Notes (Signed)
Results sent through My Chart  Schedule 03/2023 CPX fasting if not done so already

## 2022-12-31 ENCOUNTER — Other Ambulatory Visit: Payer: BC Managed Care – PPO

## 2022-12-31 DIAGNOSIS — Z7251 High risk heterosexual behavior: Secondary | ICD-10-CM

## 2023-01-01 ENCOUNTER — Other Ambulatory Visit: Payer: Self-pay | Admitting: Medical

## 2023-01-01 LAB — HIV ANTIBODY (ROUTINE TESTING W REFLEX): HIV Screen 4th Generation wRfx: NONREACTIVE

## 2023-01-01 NOTE — Progress Notes (Signed)
Results sent through MyChart

## 2023-01-03 ENCOUNTER — Telehealth: Payer: Self-pay | Admitting: Medical

## 2023-01-03 ENCOUNTER — Other Ambulatory Visit: Payer: Self-pay | Admitting: Medical

## 2023-01-03 DIAGNOSIS — Z7251 High risk heterosexual behavior: Secondary | ICD-10-CM

## 2023-01-03 DIAGNOSIS — Z209 Contact with and (suspected) exposure to unspecified communicable disease: Secondary | ICD-10-CM

## 2023-01-03 MED ORDER — EMTRICITABINE-TENOFOVIR DF 200-300 MG PO TABS: 1.0000 | ORAL_TABLET | Freq: Every day | ORAL | 0 refills | Status: DC

## 2023-01-03 NOTE — Telephone Encounter (Signed)
Prescription Request  01/03/2023  LOV: 10/01/2022  What is the name of the medication or equipment? truvada  Have you contacted your pharmacy to request a refill? Yes   Which pharmacy would you like this sent to?    [facility], [place] - [address] [place] [address] Phone: [phone] Fax: [phone]   Patient notified that their request is being sent to the clinical staff for review and that they should receive a response within 2 business days.   Please advise at Mobile [phone] (mobile)

## 2023-03-21 HISTORY — PX: NO PAST SURGERIES: SHX2092

## 2023-03-27 ENCOUNTER — Other Ambulatory Visit: Payer: Self-pay | Admitting: Medical

## 2023-03-29 ENCOUNTER — Other Ambulatory Visit: Payer: Self-pay | Admitting: Medical

## 2023-04-03 ENCOUNTER — Encounter: Payer: Self-pay | Admitting: Medical

## 2023-04-03 ENCOUNTER — Ambulatory Visit: Payer: BC Managed Care – PPO | Admitting: Medical

## 2023-04-03 VITALS — BP 128/82 | HR 64 | Temp 98.0°F | Resp 16 | Ht 74.0 in | Wt 348.2 lb

## 2023-04-03 DIAGNOSIS — Z1211 Encounter for screening for malignant neoplasm of colon: Secondary | ICD-10-CM | POA: Diagnosis not present

## 2023-04-03 DIAGNOSIS — Z0000 Encounter for general adult medical examination without abnormal findings: Secondary | ICD-10-CM | POA: Insufficient documentation

## 2023-04-03 DIAGNOSIS — L308 Other specified dermatitis: Secondary | ICD-10-CM | POA: Insufficient documentation

## 2023-04-03 DIAGNOSIS — Z113 Encounter for screening for infections with a predominantly sexual mode of transmission: Secondary | ICD-10-CM

## 2023-04-03 DIAGNOSIS — R7303 Prediabetes: Secondary | ICD-10-CM | POA: Diagnosis not present

## 2023-04-03 DIAGNOSIS — R21 Rash and other nonspecific skin eruption: Secondary | ICD-10-CM | POA: Diagnosis not present

## 2023-04-03 DIAGNOSIS — Z7251 High risk heterosexual behavior: Secondary | ICD-10-CM

## 2023-04-03 DIAGNOSIS — E782 Mixed hyperlipidemia: Secondary | ICD-10-CM | POA: Diagnosis not present

## 2023-04-03 DIAGNOSIS — Z136 Encounter for screening for cardiovascular disorders: Secondary | ICD-10-CM | POA: Diagnosis not present

## 2023-04-03 DIAGNOSIS — Z299 Encounter for prophylactic measures, unspecified: Secondary | ICD-10-CM

## 2023-04-03 LAB — EKG 12-LEAD

## 2023-04-03 LAB — POCT URINALYSIS DIP (PROADVANTAGE DEVICE)
Bilirubin, UA: NEGATIVE
Blood, UA: NEGATIVE
Glucose, UA: NEGATIVE mg/dL
Ketones, POC UA: NEGATIVE mg/dL
Leukocytes, UA: NEGATIVE
Nitrite, UA: NEGATIVE
Protein Ur, POC: NEGATIVE mg/dL
Specific Gravity, Urine: 1.025
Urobilinogen, Ur: NEGATIVE
pH, UA: 6 (ref 5.0–8.0)

## 2023-04-03 NOTE — Progress Notes (Signed)
Subjective:   HPI  Andrew Harrington is a 47 y.o. male who presents for Chief Complaint  Patient presents with   Annual Exam    Fasting CPE. No additional concerns.     Patient Care Team: Marcianna Daily, Cleda Mccreedy as PCP - General (Family Medicine) Sees dentist Sees eye doctor  Concerns: Been dealing with rash ongoing for past year.  Seeing dermatology.  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    He is compliant with Truvada prep and condoms.  Sexual preference men.  No recent symptoms, no new partners in the last year  Compliant with Lipitor and fenofibrate  Compliant with vit D supplement   Reviewed their medical, surgical, family, social, medication, and allergy history and updated chart as appropriate.  Past Medical History:  Diagnosis Date   Abnormal SPEP 09/29/2018   M-spike   Hyperlipidemia 04/04/2016   Vitamin D deficiency     Past Surgical History:  Procedure Laterality Date   NO PAST SURGERIES  03/2023    Family History  Problem Relation Age of Onset   Diabetes Mother    Aneurysm Father        aortic?  repaired   Other Father        pneumonia   Hypertension Maternal Grandmother    Alzheimer's disease Paternal Grandmother    Cancer Neg Hx      Current Outpatient Medications:    atorvastatin (LIPITOR) 10 MG tablet, TAKE 1 TABLET BY MOUTH EVERY DAY, Disp: 90 tablet, Rfl: 0   Cholecalciferol (VITAMIN D3) 50 MCG (2000 UT) capsule, TAKE 1 CAPSULE BY MOUTH EVERY DAY, Disp: 90 capsule, Rfl: 1   emtricitabine-tenofovir (TRUVADA) 200-300 MG tablet, Take 1 tablet by mouth daily., Disp: 90 tablet, Rfl: 0   fenofibrate (TRICOR) 145 MG tablet, TAKE 1 TABLET BY MOUTH EVERY DAY, Disp: 90 tablet, Rfl: 0   Multiple Vitamin (MULTIVITAMIN) tablet, Take 1 tablet by mouth daily., Disp: , Rfl:   No Known Allergies   Review of Systems  Constitutional:  Negative for chills, fever, malaise/fatigue and weight loss.  HENT:  Negative for congestion, ear pain,  hearing loss, sore throat and tinnitus.   Eyes:  Negative for blurred vision, pain and redness.  Respiratory:  Negative for cough, hemoptysis and shortness of breath.   Cardiovascular:  Negative for chest pain, palpitations, orthopnea, claudication and leg swelling.  Gastrointestinal:  Negative for abdominal pain, blood in stool, constipation, diarrhea, nausea and vomiting.  Genitourinary:  Negative for dysuria, flank pain, frequency, hematuria and urgency.  Musculoskeletal:  Negative for falls, joint pain and myalgias.  Skin:  Positive for rash. Negative for itching.  Neurological:  Negative for dizziness, tingling, speech change, weakness and headaches.  Endo/Heme/Allergies:  Negative for polydipsia. Does not bruise/bleed easily.  Psychiatric/Behavioral:  Negative for depression and memory loss. The patient is not nervous/anxious and does not have insomnia.         04/03/2023    8:32 AM 10/01/2022    3:47 PM 04/02/2022    3:25 PM 01/08/2022    8:17 AM 04/01/2020    9:31 AM  Depression screen PHQ 2/9  Decreased Interest 0 0 0 0 0  Down, Depressed, Hopeless 0 0 0 0 0  PHQ - 2 Score 0 0 0 0 0        Objective:  BP 128/82   Pulse 64   Temp 98 F (36.7 C) (Oral)   Resp 16   Ht 6\' 2"  (1.88 m)  Wt (!) 348 lb 3.2 oz (157.9 kg)   SpO2 95% Comment: room air  BMI 44.71 kg/m   General appearance: alert, no distress, WD/WN, Caucasian male Skin: Diffuse scattered flesh colored/light brown flat macular rash throughout arms and legs and torso, no worrisome lesions HEENT: normocephalic, conjunctiva/corneas normal, sclerae anicteric, PERRLA, EOMi, nares patent, no discharge or erythema, pharynx normal Oral cavity: MMM, tongue normal, teeth normal Neck: supple, no lymphadenopathy, no thyromegaly, no masses, normal ROM, no bruits Chest: non tender, normal shape and expansion Heart: RRR, normal S1, S2, no murmurs Lungs: CTA bilaterally, no wheezes, rhonchi, or rales Abdomen: +bs, soft, non  tender, non distended, no masses, no hepatomegaly, no splenomegaly, no bruits Back: non tender, normal ROM, no scoliosis Musculoskeletal: upper extremities non tender, no obvious deformity, normal ROM throughout, lower extremities non tender, no obvious deformity, normal ROM throughout Extremities: no edema, no cyanosis, no clubbing Pulses: 2+ symmetric, upper and lower extremities, normal cap refill Neurological: alert, oriented x 3, CN2-12 intact, strength normal upper extremities and lower extremities, sensation normal throughout, DTRs 2+ throughout, no cerebellar signs, gait normal Psychiatric: normal affect, behavior normal, pleasant  GU: deferred Rectal: Deferred  EKG reviewed    Assessment and Plan :   Encounter Diagnoses  Name Primary?   Annual physical exam Yes   Mixed hyperlipidemia    Screen for colon cancer    Perivascular dermatitis    Rash    Prediabetes    Prophylactic measure    High risk sexual behavior, unspecified type    Screen for STD (sexually transmitted disease)    Screening for heart disease     This visit was a preventative care visit, also known as wellness visit or routine physical.   Topics typically include healthy lifestyle, diet, exercise, preventative care, vaccinations, sick and well care, proper use of emergency dept and after hours care, as well as other concerns.     Recommendations: Continue to return yearly for your annual wellness and preventative care visits.  This gives [place] a chance to discuss healthy lifestyle, exercise, vaccinations, review your chart record, and perform screenings where appropriate.  I recommend you see your eye doctor yearly for routine vision care.  I recommend you see your dentist yearly for routine dental care including hygiene visits twice yearly.   Vaccination recommendations were reviewed Immunization History  Administered Date(s) Administered   DTaP 11/02/1977, 01/04/1978, 03/06/1978, 04/09/1979,  12/01/1982   Hepatitis B, ADULT 09/19/2016, 10/22/2016, 04/23/2017   IPV 01/04/1978, 03/06/1978, 05/10/1978, 04/09/1979, 12/01/1982   Influenza,inj,Quad PF,6+ Mos 05/17/2019   Influenza-Unspecified 06/24/2017, 06/24/2018, 05/17/2019, 05/31/2021   MMR 12/11/1978, 01/22/1996   Moderna Covid-19 Vaccine Bivalent Booster 6yrs & up 05/31/2021   Moderna Sars-Covid-2 Vaccination 09/21/2019, 10/19/2019, 07/11/2020   Td 01/22/1996, 08/20/2000   Tdap 04/04/2016     Screening for cancer: Colon cancer screening: Referral for colonoscopy  Testicular cancer screening You should do a monthly self testicular exam if you are between 22-4 years old  We discussed PSA, prostate exam, and prostate cancer screening risks/benefits.     Skin cancer screening: Check your skin regularly for new changes, growing lesions, or other lesions of concern Come in for evaluation if you have skin lesions of concern.  Lung cancer screening: If you have a greater than 20 pack year history of tobacco use, then you may qualify for lung cancer screening with a chest CT scan.   Please call your insurance company to inquire about coverage for this test.  We currently  don't have screenings for other cancers besides breast, cervical, colon, and lung cancers.  If you have a strong family history of cancer or have other cancer screening concerns, please let me know.    Bone health: Get at least 150 minutes of aerobic exercise weekly Get weight bearing exercise at least once weekly Bone density test:  A bone density test is an imaging test that uses a type of X-ray to measure the amount of calcium and other minerals in your bones. The test may be used to diagnose or screen you for a condition that causes weak or thin bones (osteoporosis), predict your risk for a broken bone (fracture), or determine how well your osteoporosis treatment is working. The bone density test is recommended for females 65 and older, or females or  males <65 if certain risk factors such as thyroid disease, long term use of steroids such as for asthma or rheumatological issues, vitamin D deficiency, estrogen deficiency, family history of osteoporosis, self or family history of fragility fracture in first degree relative.    Heart health: Get at least 150 minutes of aerobic exercise weekly Limit alcohol It is important to maintain a healthy blood pressure and healthy cholesterol numbers  Heart disease screening: Screening for heart disease includes screening for blood pressure, fasting lipids, glucose/diabetes screening, BMI height to weight ratio, reviewed of smoking status, physical activity, and diet.    Goals include blood pressure 120/80 or less, maintaining a healthy lipid/cholesterol profile, preventing diabetes or keeping diabetes numbers under good control, not smoking or using tobacco products, exercising most days per week or at least 150 minutes per week of exercise, and eating healthy variety of fruits and vegetables, healthy oils, and avoiding unhealthy food choices like fried food, fast food, high sugar and high cholesterol foods.    Other tests may possibly include EKG test, CT coronary calcium score, echocardiogram, exercise treadmill stress test.   Consider CT coronary cardiac score.  He will consider    Medical care options: I recommend you continue to seek care here first for routine care.  We try really hard to have available appointments Monday through Friday daytime hours for sick visits, acute visits, and physicals.  Urgent care should be used for after hours and weekends for significant issues that cannot wait till the next day.  The emergency department should be used for significant potentially life-threatening emergencies.  The emergency department is expensive, can often have long wait times for less significant concerns, so try to utilize primary care, urgent care, or telemedicine when possible to avoid  unnecessary trips to the emergency department.  Virtual visits and telemedicine have been introduced since the pandemic started in 2020, and can be convenient ways to receive medical care.  We offer virtual appointments as well to assist you in a variety of options to seek medical care.   Advanced Directives: I recommend you consider completing a Health Care Power of Attorney and Living Will.   These documents respect your wishes and help alleviate burdens on your loved ones if you were to become terminally ill or be in a position to need those documents enforced.    You can complete Advanced Directives yourself, have them notarized, then have copies made for our office, for you and for anybody you feel should have them in safe keeping.  Or, you can have an attorney prepare these documents.   If you haven't updated your Last Will and Testament in a while, it may be worthwhile having  an attorney prepare these documents together and save on some costs.       Separate significant issues discussed: Prophylactic measure, screen for STD-continue on Truvada, continue routine testing  Obesity-work on efforts to lose weight through healthy diet and exercise  Hyperlipidemia-compliant with statin and fenofibrates  Vitamin D deficiency-updated labs today, currently on supplement  Rash-he has had an issue with skin changes and rash for the last year or so.  He recently had a biopsy last month showing sparse perivascular dermatitis.  Etiologies could be triggered by drugs, virus, infection, allergy or other.  We discussed possibly withholding 1 medicine or all medicines for few weeks to see if the rash resolves and add back 1 medicine at a time.  There is a possibility the rash could be drug-induced.  We will see if there is any lab findings of concern today.  History of abnormal SPEP years ago.  This was reportedly resolved thought to be related to a viral infection that could have even been early COVID  before testing was available.  Consider recheck SPEP at some point.  Declines today.  Kazim "Reuel Boom" was seen today for annual exam.  Diagnoses and all orders for this visit:  Annual physical exam -     POCT Urinalysis DIP (Proadvantage Device) -     Ambulatory referral to Gastroenterology -     Comprehensive metabolic panel -     CBC with Differential/Platelet -     Sedimentation rate -     Lipid panel -     RPR+HIV+GC+CT Panel -     Hepatitis C antibody -     Hepatitis B surface antigen -     Hemoglobin A1c -     VITAMIN D 25 Hydroxy (Vit-D Deficiency, Fractures)  Mixed hyperlipidemia -     Comprehensive metabolic panel -     Lipid panel  Screen for colon cancer -     Ambulatory referral to Gastroenterology  Perivascular dermatitis -     CBC with Differential/Platelet -     Sedimentation rate  Rash  Prediabetes -     Hemoglobin A1c  Prophylactic measure  High risk sexual behavior, unspecified type  Screen for STD (sexually transmitted disease) -     RPR+HIV+GC+CT Panel -     Hepatitis C antibody -     Hepatitis B surface antigen  Screening for heart disease -     EKG 12-Lead    Follow-up 48mo for PreP follow up

## 2023-04-04 NOTE — Progress Notes (Signed)
Results sent through MyChart

## 2023-04-06 LAB — HEPATITIS B SURFACE ANTIGEN: Hepatitis B Surface Ag: NEGATIVE

## 2023-04-06 LAB — COMPREHENSIVE METABOLIC PANEL
ALT: 34 IU/L (ref 0–44)
AST: 22 IU/L (ref 0–40)
Albumin: 4.5 g/dL (ref 4.1–5.1)
Alkaline Phosphatase: 83 IU/L (ref 44–121)
BUN/Creatinine Ratio: 8 — ABNORMAL LOW (ref 9–20)
BUN: 11 mg/dL (ref 6–24)
Bilirubin Total: 0.3 mg/dL (ref 0.0–1.2)
CO2: 23 mmol/L (ref 20–29)
Calcium: 9.7 mg/dL (ref 8.7–10.2)
Chloride: 100 mmol/L (ref 96–106)
Creatinine, Ser: 1.38 mg/dL — ABNORMAL HIGH (ref 0.76–1.27)
Globulin, Total: 2.9 g/dL (ref 1.5–4.5)
Glucose: 103 mg/dL — ABNORMAL HIGH (ref 70–99)
Potassium: 4.2 mmol/L (ref 3.5–5.2)
Sodium: 136 mmol/L (ref 134–144)
Total Protein: 7.4 g/dL (ref 6.0–8.5)
eGFR: 64 mL/min/{1.73_m2} (ref >59)

## 2023-04-06 LAB — LIPID PANEL
Chol/HDL Ratio: 3.5 ratio (ref 0.0–5.0)
Cholesterol, Total: 162 mg/dL (ref 100–199)
HDL: 46 mg/dL (ref >39)
LDL Chol Calc (NIH): 90 mg/dL (ref 0–99)
Triglycerides: 147 mg/dL (ref 0–149)
VLDL Cholesterol Cal: 26 mg/dL (ref 5–40)

## 2023-04-06 LAB — CBC WITH DIFFERENTIAL/PLATELET
Basophils Absolute: 0.1 10*3/uL (ref 0.0–0.2)
Basos: 1 %
EOS (ABSOLUTE): 0.2 10*3/uL (ref 0.0–0.4)
Eos: 2 %
Hematocrit: 43.5 % (ref 37.5–51.0)
Hemoglobin: 14.8 g/dL (ref 13.0–17.7)
Immature Grans (Abs): 0 10*3/uL (ref 0.0–0.1)
Immature Granulocytes: 0 %
Lymphocytes Absolute: 2 10*3/uL (ref 0.7–3.1)
Lymphs: 30 %
MCH: 30.9 pg (ref 26.6–33.0)
MCHC: 34 g/dL (ref 31.5–35.7)
MCV: 91 fL (ref 79–97)
Monocytes Absolute: 0.5 10*3/uL (ref 0.1–0.9)
Monocytes: 7 %
Neutrophils Absolute: 4 10*3/uL (ref 1.4–7.0)
Neutrophils: 60 %
Platelets: 315 10*3/uL (ref 150–450)
RBC: 4.79 x10E6/uL (ref 4.14–5.80)
RDW: 12.7 % (ref 11.6–15.4)
WBC: 6.6 10*3/uL (ref 3.4–10.8)

## 2023-04-06 LAB — HEMOGLOBIN A1C
Est. average glucose Bld gHb Est-mCnc: 123 mg/dL
Hgb A1c MFr Bld: 5.9 % — ABNORMAL HIGH (ref 4.8–5.6)

## 2023-04-06 LAB — RPR+HIV+GC+CT PANEL
Chlamydia trachomatis, NAA: NEGATIVE
HIV Screen 4th Generation wRfx: NONREACTIVE
Neisseria Gonorrhoeae by PCR: NEGATIVE
RPR Ser Ql: NONREACTIVE

## 2023-04-06 LAB — HEPATITIS C ANTIBODY: Hep C Virus Ab: NONREACTIVE

## 2023-04-06 LAB — SEDIMENTATION RATE: Sed Rate: 8 mm/h (ref 0–15)

## 2023-04-06 LAB — VITAMIN D 25 HYDROXY (VIT D DEFICIENCY, FRACTURES): Vit D, 25-Hydroxy: 35.3 ng/mL (ref 30.0–100.0)

## 2023-04-08 NOTE — Progress Notes (Signed)
Please call and make sure he has reviewed his results and see how he wants to proceed on the recommendations

## 2023-05-28 ENCOUNTER — Telehealth: Payer: Self-pay | Admitting: Internal Medicine

## 2023-05-28 ENCOUNTER — Encounter: Payer: Self-pay | Admitting: Internal Medicine

## 2023-05-28 LAB — HM COLONOSCOPY

## 2023-05-28 NOTE — Telephone Encounter (Signed)
Error

## 2023-05-30 ENCOUNTER — Encounter: Payer: Self-pay | Admitting: Internal Medicine

## 2023-06-19 ENCOUNTER — Other Ambulatory Visit: Payer: Self-pay | Admitting: Medical

## 2023-06-19 DIAGNOSIS — Z7251 High risk heterosexual behavior: Secondary | ICD-10-CM

## 2023-06-19 DIAGNOSIS — Z209 Contact with and (suspected) exposure to unspecified communicable disease: Secondary | ICD-10-CM

## 2023-07-04 ENCOUNTER — Ambulatory Visit: Payer: BC Managed Care – PPO | Admitting: Medical

## 2023-07-04 ENCOUNTER — Encounter: Payer: Self-pay | Admitting: Medical

## 2023-07-04 VITALS — BP 120/72 | HR 86 | Wt 345.6 lb

## 2023-07-04 DIAGNOSIS — R7303 Prediabetes: Secondary | ICD-10-CM | POA: Diagnosis not present

## 2023-07-04 DIAGNOSIS — R7989 Other specified abnormal findings of blood chemistry: Secondary | ICD-10-CM

## 2023-07-04 DIAGNOSIS — Z113 Encounter for screening for infections with a predominantly sexual mode of transmission: Secondary | ICD-10-CM

## 2023-07-04 DIAGNOSIS — Z299 Encounter for prophylactic measures, unspecified: Secondary | ICD-10-CM

## 2023-07-04 DIAGNOSIS — Z7251 High risk heterosexual behavior: Secondary | ICD-10-CM | POA: Diagnosis not present

## 2023-07-04 NOTE — Patient Instructions (Signed)
Prediabetes means you have a higher than normal blood sugar level. It's not high enough to be considered type 2 diabetes yet, but without making some lifestyle changes you are more likely to develop type 2 diabetes.  If you have prediabetes, the long-term damage of diabetes, especially to your heart, blood vessels and kidneys may already be starting. You may not be able to change certain risk factors such as age, race, or family history, but you CAN make changes to your lifestyle, your eating habits, and your activity.  Although diabetes can develop at any age, the risk of prediabetes increases after age 59.  Your risk of prediabetes increases if you have a parent or sibling with type 2 diabetes.   Although it's unclear why, certain people including Black, Hispanic, American [place] and [place] American people, are more likely to develop prediabetes.  Ways to prevent or slow progression to diabetes: Eat healthy foods - Eating red meat and processed meat, and drinking sugar-sweetened beverages, is associated with a higher risk of prediabetes. A diet high in fruits, vegetables, nuts, whole grains and olive oil is associated with a lower risk of prediabetes. Get at least 150 minutes of moderate aerobic physical activity a week, or about 30 minutes on most days of the week.  The less active you are, the greater your risk of prediabetes. Physical activity helps you control your weight, uses up sugar for energy and makes the body use insulin more effectively. Lose excess weight - Being overweight is a primary risk factor for prediabetes. The more fatty tissue you have, especially inside and between the muscle and skin around your abdomen, the more resistant your cells become to insulin. Waist size. A large waist size can indicate insulin resistance. The risk of insulin resistance goes up for men with waists larger than 40 inches and for women with waists larger than 35 inches. Control your blood pressure and  cholesterol.  If your blood pressure is not 130/80 or less, discuss with your provider to help get this under control.  It is ideal to have an HDL good cholesterol number >50 and have a LDL bad cholesterol number <100.   Don't smoke One simple strategy to help you make good food choices and eat appropriate portions sizes is to divide up your plate. These three divisions on your plate promote healthy eating:  One-half: fruit and nonstarchy vegetables One-quarter: whole grains One-quarter: protein-rich foods, such as legumes, fish or lean meats   Prophylactic measure, screening for STD Continue Truvada PrEP daily Continue condom use   Elevated serum creatinine kidney marker Drinking least to 100 ounces of water daily Due to abnormal kidney function, and in order to protect your kidneys, I recommend you generally avoid medications that can harm the kidneys such as ibuprofen, Aleve, Advil, Motrin, Naprosyn, or prescription anti-inflammatories which are used for pain, inflammation, and arthritis.   You should avoid dehydration which can harm the kidneys.

## 2023-07-04 NOTE — Progress Notes (Signed)
Subjective:  Andrew Harrington is a 47 y.o. male who presents for Chief Complaint  Patient presents with   Medical Management of Chronic Issues    3 month check up on PREP     Here for recheck on medication.  Compliant with prep Truvada.  He did have a condom break this weekend.  Wants routine testing.  At his recent visit in August for physical his creatinine was elevated and blood sugar elevated.  He is trying to be careful with diet.  he gets some activity or exercise.  No other aggravating or relieving factors.    No other c/o.   Past Medical History:  Diagnosis Date   Abnormal SPEP 09/29/2018   M-spike   Hyperlipidemia 04/04/2016   Vitamin D deficiency    Current Outpatient Medications on File Prior to Visit  Medication Sig Dispense Refill   atorvastatin (LIPITOR) 10 MG tablet TAKE 1 TABLET BY MOUTH EVERY DAY 90 tablet 0   Cholecalciferol (VITAMIN D3) 50 MCG (2000 UT) capsule TAKE 1 CAPSULE BY MOUTH EVERY DAY 90 capsule 1   emtricitabine-tenofovir (TRUVADA) 200-300 MG tablet TAKE 1 TABLET BY MOUTH 1 TIME A DAY 90 tablet 0   fenofibrate (TRICOR) 145 MG tablet TAKE 1 TABLET BY MOUTH EVERY DAY 90 tablet 0   Multiple Vitamin (MULTIVITAMIN) tablet Take 1 tablet by mouth daily.     OPZELURA 1.5 % CREA      No current facility-administered medications on file prior to visit.     The following portions of the patient's history were reviewed and updated as appropriate: allergies, current medications, past family history, past medical history, past social history, past surgical history and problem list.  ROS Otherwise as in subjective above    Objective: BP 120/72   Pulse 86   Wt (!) 345 lb 9.6 oz (156.8 kg)   BMI 44.37 kg/m   General appearance: alert, no distress, well developed, well nourished    Assessment: Encounter Diagnoses  Name Primary?   Prediabetes Yes   Elevated serum creatinine    High risk sexual behavior, unspecified type    Prophylactic measure     Screen for STD (sexually transmitted disease)      Plan: Prediabetes means you have a higher than normal blood sugar level. It's not high enough to be considered type 2 diabetes yet, but without making some lifestyle changes you are more likely to develop type 2 diabetes.  If you have prediabetes, the long-term damage of diabetes, especially to your heart, blood vessels and kidneys may already be starting. You may not be able to change certain risk factors such as age, race, or family history, but you CAN make changes to your lifestyle, your eating habits, and your activity.  Although diabetes can develop at any age, the risk of prediabetes increases after age 54.  Your risk of prediabetes increases if you have a parent or sibling with type 2 diabetes.   Although it's unclear why, certain people including Black, Hispanic, American [place] and [place] American people, are more likely to develop prediabetes.  Ways to prevent or slow progression to diabetes: Eat healthy foods - Eating red meat and processed meat, and drinking sugar-sweetened beverages, is associated with a higher risk of prediabetes. A diet high in fruits, vegetables, nuts, whole grains and olive oil is associated with a lower risk of prediabetes. Get at least 150 minutes of moderate aerobic physical activity a week, or about 30 minutes on most days of the  week.  The less active you are, the greater your risk of prediabetes. Physical activity helps you control your weight, uses up sugar for energy and makes the body use insulin more effectively. Lose excess weight - Being overweight is a primary risk factor for prediabetes. The more fatty tissue you have, especially inside and between the muscle and skin around your abdomen, the more resistant your cells become to insulin. Waist size. A large waist size can indicate insulin resistance. The risk of insulin resistance goes up for men with waists larger than 40 inches and for women with waists  larger than 35 inches. Control your blood pressure and cholesterol.  If your blood pressure is not 130/80 or less, discuss with your provider to help get this under control.  It is ideal to have an HDL good cholesterol number >50 and have a LDL bad cholesterol number <100.   Don't smoke One simple strategy to help you make good food choices and eat appropriate portions sizes is to divide up your plate. These three divisions on your plate promote healthy eating:  One-half: fruit and nonstarchy vegetables One-quarter: whole grains One-quarter: protein-rich foods, such as legumes, fish or lean meats   Prophylactic measure, screening for STD Continue Truvada PrEP daily Continue condom use   Elevated serum creatinine kidney marker Drinking least to 100 ounces of water daily Due to abnormal kidney function, and in order to protect your kidneys, I recommend you generally avoid medications that can harm the kidneys such as ibuprofen, Aleve, Advil, Motrin, Naprosyn, or prescription anti-inflammatories which are used for pain, inflammation, and arthritis.   You should avoid dehydration which can harm the kidneys.   Andrew "Reuel Boom" was seen today for medical management of chronic issues.  Diagnoses and all orders for this visit:  Prediabetes  Elevated serum creatinine  High risk sexual behavior, unspecified type -     RPR+HIV+GC+CT Panel  Prophylactic measure -     RPR+HIV+GC+CT Panel  Screen for STD (sexually transmitted disease) -     RPR+HIV+GC+CT Panel   Follow up: pending labs

## 2023-07-05 ENCOUNTER — Other Ambulatory Visit: Payer: Self-pay | Admitting: Medical

## 2023-07-05 ENCOUNTER — Other Ambulatory Visit: Payer: Self-pay | Admitting: Internal Medicine

## 2023-07-05 DIAGNOSIS — Z7251 High risk heterosexual behavior: Secondary | ICD-10-CM

## 2023-07-05 DIAGNOSIS — Z209 Contact with and (suspected) exposure to unspecified communicable disease: Secondary | ICD-10-CM

## 2023-07-05 MED ORDER — FENOFIBRATE 145 MG PO TABS: 145.0000 mg | ORAL_TABLET | Freq: Every day | ORAL | 2 refills | Status: DC

## 2023-07-05 MED ORDER — ATORVASTATIN CALCIUM 10 MG PO TABS: 10.0000 mg | ORAL_TABLET | Freq: Every day | ORAL | 2 refills | Status: DC

## 2023-07-05 MED ORDER — EMTRICITABINE-TENOFOVIR DF 200-300 MG PO TABS: ORAL_TABLET | ORAL | 0 refills | Status: DC

## 2023-07-05 NOTE — Progress Notes (Signed)
Negative for HIV and syphilis.  Still pending gonorrhea and Chlamydia test

## 2023-07-06 LAB — RPR+HIV+GC+CT PANEL
Chlamydia trachomatis, NAA: NEGATIVE
HIV Screen 4th Generation wRfx: NONREACTIVE
Neisseria Gonorrhoeae by PCR: NEGATIVE
RPR Ser Ql: NONREACTIVE

## 2023-07-08 MED ORDER — FENOFIBRATE 145 MG PO TABS: 145.0000 mg | ORAL_TABLET | Freq: Every day | ORAL | 2 refills | Status: DC

## 2023-07-08 MED ORDER — ATORVASTATIN CALCIUM 10 MG PO TABS: 10.0000 mg | ORAL_TABLET | Freq: Every day | ORAL | 2 refills | Status: DC

## 2023-07-08 NOTE — Progress Notes (Signed)
Results sent through MyChart

## 2023-09-16 ENCOUNTER — Other Ambulatory Visit: Payer: Self-pay | Admitting: Medical

## 2023-09-16 DIAGNOSIS — Z7251 High risk heterosexual behavior: Secondary | ICD-10-CM

## 2023-09-16 DIAGNOSIS — Z209 Contact with and (suspected) exposure to unspecified communicable disease: Secondary | ICD-10-CM

## 2023-09-16 NOTE — Telephone Encounter (Signed)
Pt has an apt in February

## 2023-10-07 ENCOUNTER — Other Ambulatory Visit: Payer: 59

## 2023-10-07 DIAGNOSIS — Z299 Encounter for prophylactic measures, unspecified: Secondary | ICD-10-CM

## 2023-10-07 DIAGNOSIS — Z7251 High risk heterosexual behavior: Secondary | ICD-10-CM

## 2023-10-08 LAB — RPR+HIV+GC+CT PANEL
Chlamydia trachomatis, NAA: NEGATIVE
HIV Screen 4th Generation wRfx: NONREACTIVE
Neisseria Gonorrhoeae by PCR: NEGATIVE
RPR Ser Ql: NONREACTIVE

## 2023-10-09 ENCOUNTER — Other Ambulatory Visit: Payer: Self-pay | Admitting: Medical

## 2023-10-09 NOTE — Progress Notes (Signed)
 Results sent through MyChart

## 2023-10-10 MED ORDER — FENOFIBRATE 145 MG PO TABS: 145.0000 mg | ORAL_TABLET | Freq: Every day | ORAL | 2 refills | Status: DC

## 2023-10-10 MED ORDER — ATORVASTATIN CALCIUM 10 MG PO TABS: 10.0000 mg | ORAL_TABLET | Freq: Every day | ORAL | 2 refills | Status: DC

## 2023-12-18 ENCOUNTER — Other Ambulatory Visit: Payer: Self-pay | Admitting: Medical

## 2023-12-18 DIAGNOSIS — Z209 Contact with and (suspected) exposure to unspecified communicable disease: Secondary | ICD-10-CM

## 2023-12-18 DIAGNOSIS — Z7251 High risk heterosexual behavior: Secondary | ICD-10-CM

## 2024-01-06 ENCOUNTER — Other Ambulatory Visit: Payer: 59

## 2024-01-06 DIAGNOSIS — Z7251 High risk heterosexual behavior: Secondary | ICD-10-CM

## 2024-01-06 DIAGNOSIS — Z299 Encounter for prophylactic measures, unspecified: Secondary | ICD-10-CM

## 2024-01-07 ENCOUNTER — Other Ambulatory Visit: Payer: Self-pay | Admitting: Medical

## 2024-01-07 ENCOUNTER — Other Ambulatory Visit: Payer: Self-pay | Admitting: Internal Medicine

## 2024-01-07 ENCOUNTER — Ambulatory Visit: Payer: Self-pay | Admitting: Medical

## 2024-01-07 DIAGNOSIS — Z7251 High risk heterosexual behavior: Secondary | ICD-10-CM

## 2024-01-07 DIAGNOSIS — Z209 Contact with and (suspected) exposure to unspecified communicable disease: Secondary | ICD-10-CM

## 2024-01-07 LAB — RPR+HIV+GC+CT PANEL
Chlamydia trachomatis, NAA: NEGATIVE
HIV Screen 4th Generation wRfx: NONREACTIVE
Neisseria Gonorrhoeae by PCR: NEGATIVE
RPR Ser Ql: NONREACTIVE

## 2024-01-07 MED ORDER — EMTRICITABINE-TENOFOVIR DF 200-300 MG PO TABS: ORAL_TABLET | ORAL | 0 refills | Status: DC

## 2024-01-07 NOTE — Progress Notes (Signed)
 Results sent through MyChart

## 2024-03-20 HISTORY — PX: NO PAST SURGERIES: SHX2092

## 2024-03-23 ENCOUNTER — Other Ambulatory Visit: Payer: Self-pay | Admitting: Medical

## 2024-03-23 DIAGNOSIS — Z7251 High risk heterosexual behavior: Secondary | ICD-10-CM

## 2024-03-23 DIAGNOSIS — Z209 Contact with and (suspected) exposure to unspecified communicable disease: Secondary | ICD-10-CM

## 2024-04-02 ENCOUNTER — Encounter: Payer: Self-pay | Admitting: Medical

## 2024-04-02 ENCOUNTER — Ambulatory Visit
Admission: RE | Admit: 2024-04-02 | Discharge: 2024-04-02 | Disposition: A | Discharge status: Home / Self Care | Source: Ambulatory Visit | Attending: Medical | Admitting: Medical

## 2024-04-02 ENCOUNTER — Ambulatory Visit: Payer: BC Managed Care – PPO | Admitting: Medical

## 2024-04-02 VITALS — BP 120/80 | HR 77 | Ht 74.0 in | Wt 340.4 lb

## 2024-04-02 DIAGNOSIS — M25561 Pain in right knee: Secondary | ICD-10-CM

## 2024-04-02 DIAGNOSIS — Z209 Contact with and (suspected) exposure to unspecified communicable disease: Secondary | ICD-10-CM

## 2024-04-02 DIAGNOSIS — R778 Other specified abnormalities of plasma proteins: Secondary | ICD-10-CM | POA: Diagnosis not present

## 2024-04-02 DIAGNOSIS — Z7251 High risk heterosexual behavior: Secondary | ICD-10-CM | POA: Diagnosis not present

## 2024-04-02 DIAGNOSIS — Z7185 Encounter for immunization safety counseling: Secondary | ICD-10-CM | POA: Diagnosis not present

## 2024-04-02 DIAGNOSIS — E66813 Obesity, class 3: Secondary | ICD-10-CM

## 2024-04-02 DIAGNOSIS — Z0000 Encounter for general adult medical examination without abnormal findings: Secondary | ICD-10-CM | POA: Diagnosis not present

## 2024-04-02 DIAGNOSIS — Z6841 Body Mass Index (BMI) 40.0 and over, adult: Secondary | ICD-10-CM

## 2024-04-02 DIAGNOSIS — G8929 Other chronic pain: Secondary | ICD-10-CM

## 2024-04-02 DIAGNOSIS — Z113 Encounter for screening for infections with a predominantly sexual mode of transmission: Secondary | ICD-10-CM

## 2024-04-02 DIAGNOSIS — M25562 Pain in left knee: Secondary | ICD-10-CM

## 2024-04-02 DIAGNOSIS — Z1389 Encounter for screening for other disorder: Secondary | ICD-10-CM

## 2024-04-02 DIAGNOSIS — K760 Fatty (change of) liver, not elsewhere classified: Secondary | ICD-10-CM

## 2024-04-02 DIAGNOSIS — E782 Mixed hyperlipidemia: Secondary | ICD-10-CM

## 2024-04-02 DIAGNOSIS — R7303 Prediabetes: Secondary | ICD-10-CM | POA: Diagnosis not present

## 2024-04-02 DIAGNOSIS — Z299 Encounter for prophylactic measures, unspecified: Secondary | ICD-10-CM

## 2024-04-02 DIAGNOSIS — Z833 Family history of diabetes mellitus: Secondary | ICD-10-CM

## 2024-04-02 NOTE — Progress Notes (Signed)
 Subjective:   HPI  Andrew Harrington is a 48 y.o. male who presents for Chief Complaint  Patient presents with   Annual Exam    Fasting cpe, knee pain- been moving and walking up stairs.    Patient Care Team: Dereona Kolodny, Alm GORMAN RIGGERS as PCP - General (Family Medicine) Sees dentist Sees eye doctor Dr. Belvie Just, GI Dermatology   Concerns: Been having some knee pain.  Moved recently, and been going up and down a lot of stairs.   Left knee flaring up.  Wearing knee brace the last 3 days.  Prior to moving was having knee pains periodically but not daily.  Using some cold therapy.    Sees dermatology, on Opzelura for vitiligo  He is compliant with Truvada  prep and condoms.  Sexual preference men.  No recent symptoms, no new partners in the last year.  May want to switch to Descovy regarding bone safety.  Compliant with Lipitor and fenofibrate   Compliant with vit D supplement   Reviewed their medical, surgical, family, social, medication, and allergy history and updated chart as appropriate.  Past Medical History:  Diagnosis Date   Abnormal SPEP 09/29/2018   M-spike   Hyperlipidemia 04/04/2016   Impaired fasting blood sugar    Prophylactic measure    on PreP for HIV prevention   Vitamin D  deficiency    Vitiligo     Past Surgical History:  Procedure Laterality Date   NO PAST SURGERIES  03/2024    Family History  Problem Relation Age of Onset   Diabetes Mother    Aneurysm Father        aortic?  repaired   Other Father        pneumonia   Hypertension Maternal Grandmother    Alzheimer's disease Paternal Grandmother    Cancer Neg Hx      Current Outpatient Medications:    atorvastatin  (LIPITOR) 10 MG tablet, Take 1 tablet (10 mg total) by mouth daily., Disp: 90 tablet, Rfl: 2   Cholecalciferol (VITAMIN D3) 50 MCG (2000 UT) capsule, TAKE 1 CAPSULE BY MOUTH EVERY DAY, Disp: 90 capsule, Rfl: 1   emtricitabine -tenofovir  (TRUVADA ) 200-300 MG tablet, TAKE 1 TABLET BY  MOUTH 1 TIME A DAY, Disp: 90 tablet, Rfl: 0   fenofibrate  (TRICOR ) 145 MG tablet, Take 1 tablet (145 mg total) by mouth daily., Disp: 90 tablet, Rfl: 2   Multiple Vitamin (MULTIVITAMIN) tablet, Take 1 tablet by mouth daily., Disp: , Rfl:    OPZELURA 1.5 % CREA, , Disp: , Rfl:   No Known Allergies   Review of Systems  Constitutional:  Negative for chills, fever, malaise/fatigue and weight loss.  HENT:  Negative for congestion, ear pain, hearing loss, sore throat and tinnitus.   Eyes:  Negative for blurred vision, pain and redness.  Respiratory:  Negative for cough, hemoptysis and shortness of breath.   Cardiovascular:  Negative for chest pain, palpitations, orthopnea, claudication and leg swelling.  Gastrointestinal:  Negative for abdominal pain, blood in stool, constipation, diarrhea, nausea and vomiting.  Genitourinary:  Negative for dysuria, flank pain, frequency, hematuria and urgency.  Musculoskeletal:  Positive for joint pain. Negative for falls and myalgias.  Skin:  Negative for itching and rash.  Neurological:  Negative for dizziness, tingling, speech change, weakness and headaches.  Endo/Heme/Allergies:  Negative for polydipsia. Does not bruise/bleed easily.  Psychiatric/Behavioral:  Negative for depression and memory loss. The patient is not nervous/anxious and does not have insomnia.  04/02/2024    9:09 AM 04/03/2023    8:32 AM 10/01/2022    3:47 PM 04/02/2022    3:25 PM 01/08/2022    8:17 AM  Depression screen PHQ 2/9  Decreased Interest 0 0 0 0 0  Down, Depressed, Hopeless 0 0 0 0 0  PHQ - 2 Score 0 0 0 0 0        Objective:  BP 120/80   Pulse 77   Ht 6' 2 (1.88 m)   Wt (!) 340 lb 6.4 oz (154.4 kg)   BMI 43.70 kg/m   General appearance: alert, no distress, WD/WN, Caucasian male Skin: Diffuse scattered patches of hypopigmentation throughout arms and legs and torso, no worrisome lesions HEENT: normocephalic, conjunctiva/corneas normal, sclerae anicteric,  PERRLA, EOMi, nares patent, no discharge or erythema, pharynx normal Oral cavity: MMM, tongue normal, teeth normal Neck: supple, no lymphadenopathy, no thyromegaly, no masses, normal ROM, no bruits Chest: non tender, normal shape and expansion Heart: RRR, normal S1, S2, no murmurs Lungs: CTA bilaterally, no wheezes, rhonchi, or rales Abdomen: +bs, soft, non tender, non distended, no masses, no hepatomegaly, no splenomegaly, no bruits Back: non tender, normal ROM, no scoliosis Musculoskeletal: upper extremities non tender, no obvious deformity, normal ROM throughout, lower extremities non tender, no obvious deformity, normal ROM throughout Extremities: no edema, no cyanosis, no clubbing Pulses: 2+ symmetric, upper and lower extremities, normal cap refill Neurological: alert, oriented x 3, CN2-12 intact, strength normal upper extremities and lower extremities, sensation normal throughout, DTRs 2+ throughout, no cerebellar signs, gait normal Psychiatric: normal affect, behavior normal, pleasant  GU: deferred Rectal: Deferred     Assessment and Plan :   Encounter Diagnoses  Name Primary?   Encounter for health maintenance examination in adult Yes   Abnormal SPEP    Vaccine counseling    Screening for hematuria or proteinuria    Contact with or exposure to communicable disease    Screen for STD (sexually transmitted disease)    Prophylactic measure    Prediabetes    Class 3 severe obesity with body mass index (BMI) of 40.0 to 44.9 in adult, unspecified obesity type, unspecified whether serious comorbidity present    High risk sexual behavior, unspecified type    Mixed hyperlipidemia    Family history of diabetes mellitus (DM)    Fatty liver    BMI 40.0-44.9, adult (HCC)    Chronic pain of both knees     This visit was a preventative care visit, also known as wellness visit or routine physical.   Topics typically include healthy lifestyle, diet, exercise, preventative care,  vaccinations, sick and well care, proper use of emergency dept and after hours care, as well as other concerns.     Recommendations: Continue to return yearly for your annual wellness and preventative care visits.  This gives us  a chance to discuss healthy lifestyle, exercise, vaccinations, review your chart record, and perform screenings where appropriate.  I recommend you see your eye doctor yearly for routine vision care.  I recommend you see your dentist yearly for routine dental care including hygiene visits twice yearly.   Vaccination recommendations were reviewed Immunization History  Administered Date(s) Administered   DTaP 11/02/1977, 01/04/1978, 03/06/1978, 04/09/1979, 12/01/1982   Hepatitis B, ADULT 09/19/2016, 10/22/2016, 04/23/2017   IPV 01/04/1978, 03/06/1978, 05/10/1978, 04/09/1979, 12/01/1982   Influenza,inj,Quad PF,6+ Mos 05/17/2019   Influenza-Unspecified 06/24/2017, 06/24/2018, 05/17/2019, 05/31/2021   MMR 12/11/1978, 01/22/1996   Moderna Covid-19 Vaccine Bivalent Booster 42yrs & up 05/31/2021  Moderna Sars-Covid-2 Vaccination 09/21/2019, 10/19/2019, 07/11/2020   Td 01/22/1996, 08/20/2000   Tdap 04/04/2016   Advised yearly flu shot   Screening for cancer: Colon cancer screening: 05/2023 colonoscopy report reviewed.  Repeat in 7 years.  Testicular cancer screening You should do a monthly self testicular exam if you are between 1-87 years old  We discussed PSA, prostate exam, and prostate cancer screening risks/benefits.     Skin cancer screening: Check your skin regularly for new changes, growing lesions, or other lesions of concern Come in for evaluation if you have skin lesions of concern.  Lung cancer screening: If you have a greater than 20 pack year history of tobacco use, then you may qualify for lung cancer screening with a chest CT scan.   Please call your insurance company to inquire about coverage for this test.  We currently don't have  screenings for other cancers besides breast, cervical, colon, and lung cancers.  If you have a strong family history of cancer or have other cancer screening concerns, please let me know.    Bone health: Get at least 150 minutes of aerobic exercise weekly Get weight bearing exercise at least once weekly Bone density test:  A bone density test is an imaging test that uses a type of X-ray to measure the amount of calcium  and other minerals in your bones. The test may be used to diagnose or screen you for a condition that causes weak or thin bones (osteoporosis), predict your risk for a broken bone (fracture), or determine how well your osteoporosis treatment is working. The bone density test is recommended for females 65 and older, or females or males <65 if certain risk factors such as thyroid disease, long term use of steroids such as for asthma or rheumatological issues, vitamin D  deficiency, estrogen deficiency, family history of osteoporosis, self or family history of fragility fracture in first degree relative.    Heart health: Get at least 150 minutes of aerobic exercise weekly Limit alcohol It is important to maintain a healthy blood pressure and healthy cholesterol numbers  Heart disease screening: Screening for heart disease includes screening for blood pressure, fasting lipids, glucose/diabetes screening, BMI height to weight ratio, reviewed of smoking status, physical activity, and diet.    Goals include blood pressure 120/80 or less, maintaining a healthy lipid/cholesterol profile, preventing diabetes or keeping diabetes numbers under good control, not smoking or using tobacco products, exercising most days per week or at least 150 minutes per week of exercise, and eating healthy variety of fruits and vegetables, healthy oils, and avoiding unhealthy food choices like fried food, fast food, high sugar and high cholesterol foods.    Other tests may possibly include EKG test, CT  coronary calcium  score, echocardiogram, exercise treadmill stress test.   Consider CT coronary cardiac score.  He will consider    Medical care options: I recommend you continue to seek care here first for routine care.  We try really hard to have available appointments Monday through Friday daytime hours for sick visits, acute visits, and physicals.  Urgent care should be used for after hours and weekends for significant issues that cannot wait till the next day.  The emergency department should be used for significant potentially life-threatening emergencies.  The emergency department is expensive, can often have long wait times for less significant concerns, so try to utilize primary care, urgent care, or telemedicine when possible to avoid unnecessary trips to the emergency department.  Virtual visits and telemedicine have  been introduced since the pandemic started in 2020, and can be convenient ways to receive medical care.  We offer virtual appointments as well to assist you in a variety of options to seek medical care.   Advanced Directives: I recommend you consider completing a Health Care Power of Attorney and Living Will.   These documents respect your wishes and help alleviate burdens on your loved ones if you were to become terminally ill or be in a position to need those documents enforced.    You can complete Advanced Directives yourself, have them notarized, then have copies made for our office, for you and for anybody you feel should have them in safe keeping.  Or, you can have an attorney prepare these documents.   If you haven't updated your Last Will and Testament in a while, it may be worthwhile having an attorney prepare these documents together and save on some costs.       Separate significant issues discussed: Prophylactic measure, screen for STD-continue on Truvada , continue condom use, continue routine testing.  Discussed other options for therapy.  Obesity, fatty  liver-work on efforts to lose weight through healthy diet and exercise  Hyperlipidemia-compliant with statin and fenofibrate , labs today  Vitamin D  deficiency-continue supplement  History of abnormal SPEP years ago.  This was reportedly resolved thought to be related to a viral infection that could have even been early COVID before testing was available.  Repeat SPEP today  Prediabetes - updated labs today, work on lifestyle changes  Knee pain, acute left, chronic bilat Go for baseline xrays Continue cold therapy, knee brace, relative rest and oral tylenol  for left knee acute flare up   Andrew Harrington was seen today for annual exam.  Diagnoses and all orders for this visit:  Encounter for health maintenance examination in adult -     CBC -     Comprehensive metabolic panel with GFR -     Lipid panel -     TSH -     Hemoglobin A1c -     RPR+HIV+GC+CT Panel -     Urinalysis, Routine w reflex microscopic -     Protein electrophoresis, serum -     Microalbumin/Creatinine Ratio, Urine  Abnormal SPEP -     Protein electrophoresis, serum  Vaccine counseling  Screening for hematuria or proteinuria -     Urinalysis, Routine w reflex microscopic -     Protein electrophoresis, serum -     Microalbumin/Creatinine Ratio, Urine  Contact with or exposure to communicable disease -     RPR+HIV+GC+CT Panel  Screen for STD (sexually transmitted disease) -     RPR+HIV+GC+CT Panel  Prophylactic measure -     RPR+HIV+GC+CT Panel  Prediabetes -     Hemoglobin A1c  Class 3 severe obesity with body mass index (BMI) of 40.0 to 44.9 in adult, unspecified obesity type, unspecified whether serious comorbidity present -     TSH  High risk sexual behavior, unspecified type  Mixed hyperlipidemia -     Lipid panel  Family history of diabetes mellitus (DM)  Fatty liver  BMI 40.0-44.9, adult (HCC)  Chronic pain of both knees -     DG Knee Complete 4 Views Left; Future -     DG Knee  Complete 4 Views Right; Future    Follow-up 60mo for PreP follow up

## 2024-04-02 NOTE — Patient Instructions (Addendum)
 Please go to [facility] for your bilat knee xray.   Their hours are 8am - 4:30 pm Monday - Friday.  Take your insurance card with you.  [facility] [phone]   [address] [place], [place] [address]   Medication options for prep Apretude Descovy Truvada  Rue

## 2024-04-03 ENCOUNTER — Other Ambulatory Visit: Payer: Self-pay | Admitting: Medical

## 2024-04-03 ENCOUNTER — Encounter: Payer: BC Managed Care – PPO | Admitting: Medical

## 2024-04-03 ENCOUNTER — Ambulatory Visit: Payer: Self-pay | Admitting: Medical

## 2024-04-03 NOTE — Progress Notes (Signed)
 Results through MyChart

## 2024-04-06 ENCOUNTER — Other Ambulatory Visit: Payer: Self-pay | Admitting: Medical

## 2024-04-06 LAB — LIPID PANEL
Chol/HDL Ratio: 3.6 ratio (ref 0.0–5.0)
Cholesterol, Total: 139 mg/dL (ref 100–199)
HDL: 39 mg/dL — ABNORMAL LOW (ref >39)
LDL Chol Calc (NIH): 82 mg/dL (ref 0–99)
Triglycerides: 95 mg/dL (ref 0–149)
VLDL Cholesterol Cal: 18 mg/dL (ref 5–40)

## 2024-04-06 LAB — COMPREHENSIVE METABOLIC PANEL WITH GFR
ALT: 15 IU/L (ref 0–44)
AST: 17 IU/L (ref 0–40)
Albumin: 4.2 g/dL (ref 4.1–5.1)
Alkaline Phosphatase: 73 IU/L (ref 44–121)
BUN/Creatinine Ratio: 7 — ABNORMAL LOW (ref 9–20)
BUN: 8 mg/dL (ref 6–24)
Bilirubin Total: 0.5 mg/dL (ref 0.0–1.2)
CO2: 20 mmol/L (ref 20–29)
Calcium: 9.5 mg/dL (ref 8.7–10.2)
Chloride: 101 mmol/L (ref 96–106)
Creatinine, Ser: 1.17 mg/dL (ref 0.76–1.27)
Globulin, Total: 2.6 g/dL (ref 1.5–4.5)
Glucose: 95 mg/dL (ref 70–99)
Potassium: 4.8 mmol/L (ref 3.5–5.2)
Sodium: 137 mmol/L (ref 134–144)
Total Protein: 6.8 g/dL (ref 6.0–8.5)
eGFR: 78 mL/min/1.73 (ref >59)

## 2024-04-06 LAB — URINALYSIS, ROUTINE W REFLEX MICROSCOPIC
Bilirubin, UA: NEGATIVE
Glucose, UA: NEGATIVE
Ketones, UA: NEGATIVE
Leukocytes,UA: NEGATIVE
Nitrite, UA: NEGATIVE
RBC, UA: NEGATIVE
Specific Gravity, UA: 1.018 (ref 1.005–1.030)
Urobilinogen, Ur: 1 mg/dL (ref 0.2–1.0)
pH, UA: 6.5 (ref 5.0–7.5)

## 2024-04-06 LAB — PROTEIN ELECTROPHORESIS, SERUM
A/G Ratio: 1.1 (ref 0.7–1.7)
Albumin ELP: 3.6 g/dL (ref 2.9–4.4)
Alpha 1: 0.3 g/dL (ref 0.0–0.4)
Alpha 2: 0.6 g/dL (ref 0.4–1.0)
Beta: 1.1 g/dL (ref 0.7–1.3)
Gamma Globulin: 1.2 g/dL (ref 0.4–1.8)
Globulin, Total: 3.2 g/dL (ref 2.2–3.9)

## 2024-04-06 LAB — CBC
Hematocrit: 40.6 % (ref 37.5–51.0)
Hemoglobin: 13.2 g/dL (ref 13.0–17.7)
MCH: 30.6 pg (ref 26.6–33.0)
MCHC: 32.5 g/dL (ref 31.5–35.7)
MCV: 94 fL (ref 79–97)
Platelets: 319 x10E3/uL (ref 150–450)
RBC: 4.32 x10E6/uL (ref 4.14–5.80)
RDW: 12.4 % (ref 11.6–15.4)
WBC: 4.8 x10E3/uL (ref 3.4–10.8)

## 2024-04-06 LAB — HEMOGLOBIN A1C
Est. average glucose Bld gHb Est-mCnc: 114 mg/dL
Hgb A1c MFr Bld: 5.6 % (ref 4.8–5.6)

## 2024-04-06 LAB — RPR+HIV+GC+CT PANEL
Chlamydia trachomatis, NAA: NEGATIVE
HIV Screen 4th Generation wRfx: NONREACTIVE
Neisseria Gonorrhoeae by PCR: NEGATIVE
RPR Ser Ql: NONREACTIVE

## 2024-04-06 LAB — TSH: TSH: 0.936 u[IU]/mL (ref 0.450–4.500)

## 2024-04-06 LAB — MICROALBUMIN / CREATININE URINE RATIO
Creatinine, Urine: 133.1 mg/dL
Microalb/Creat Ratio: <2 mg/g{creat} (ref 0–29)
Microalbumin, Urine: <3 ug/mL

## 2024-04-06 MED ORDER — VITAMIN D3 50 MCG (2000 UT) PO CAPS: 2000.0000 [IU] | ORAL_CAPSULE | Freq: Every day | ORAL | 2 refills | Status: AC

## 2024-04-06 NOTE — Progress Notes (Signed)
 Results to MyChart

## 2024-04-06 NOTE — Progress Notes (Signed)
 Results through MyChart

## 2024-04-06 NOTE — Progress Notes (Signed)
 LVM for pt to check MyChart for his results.

## 2024-04-07 MED ORDER — FENOFIBRATE 145 MG PO TABS: 145.0000 mg | ORAL_TABLET | Freq: Every day | ORAL | 2 refills | Status: AC

## 2024-04-07 MED ORDER — ATORVASTATIN CALCIUM 10 MG PO TABS: 10.0000 mg | ORAL_TABLET | Freq: Every day | ORAL | 2 refills | Status: AC

## 2024-04-13 NOTE — Telephone Encounter (Signed)
 I have called and asked for this to be read as a STAT since its been since 04/02/24

## 2024-04-13 NOTE — Progress Notes (Signed)
 Results through MyChart

## 2024-06-15 ENCOUNTER — Other Ambulatory Visit: Payer: Self-pay | Admitting: Medical

## 2024-06-15 DIAGNOSIS — Z209 Contact with and (suspected) exposure to unspecified communicable disease: Secondary | ICD-10-CM

## 2024-06-15 DIAGNOSIS — Z7251 High risk heterosexual behavior: Secondary | ICD-10-CM

## 2024-06-30 ENCOUNTER — Other Ambulatory Visit: Payer: Self-pay

## 2024-06-30 DIAGNOSIS — Z299 Encounter for prophylactic measures, unspecified: Secondary | ICD-10-CM

## 2024-06-30 DIAGNOSIS — Z7251 High risk heterosexual behavior: Secondary | ICD-10-CM

## 2024-07-01 LAB — RPR+HIV+GC+CT PANEL
Chlamydia trachomatis, NAA: NEGATIVE
HIV Screen 4th Generation wRfx: NONREACTIVE
Neisseria Gonorrhoeae by PCR: NEGATIVE
RPR Ser Ql: NONREACTIVE

## 2024-07-02 ENCOUNTER — Ambulatory Visit: Payer: Self-pay | Admitting: Medical

## 2024-07-02 NOTE — Progress Notes (Signed)
 Results through MyChart

## 2024-09-17 ENCOUNTER — Other Ambulatory Visit: Payer: Self-pay | Admitting: Medical

## 2024-09-17 DIAGNOSIS — Z209 Contact with and (suspected) exposure to unspecified communicable disease: Secondary | ICD-10-CM

## 2024-09-17 DIAGNOSIS — Z7251 High risk heterosexual behavior: Secondary | ICD-10-CM

## 2024-10-05 ENCOUNTER — Ambulatory Visit

## 2025-04-02 ENCOUNTER — Encounter: Payer: Self-pay | Admitting: Medical
